# Patient Record
Sex: Female | Born: 1990
Health system: Southern US, Community
[De-identification: ages and names within clinical notes are randomized; demographics above are authoritative.]

## PROBLEM LIST (undated history)

## (undated) HISTORY — PX: WISDOM TOOTH EXTRACTION: SHX21

## (undated) HISTORY — PX: TONSILLECTOMY: SUR1361

---

## 2014-03-10 ENCOUNTER — Ambulatory Visit: Payer: BC Managed Care – PPO

## 2014-03-10 ENCOUNTER — Ambulatory Visit (INDEPENDENT_AMBULATORY_CARE_PROVIDER_SITE_OTHER): Payer: BC Managed Care – PPO | Admitting: Internal Medicine

## 2014-03-10 VITALS — BP 118/60 | HR 75 | Temp 98.5°F | Resp 16 | Ht 64.0 in | Wt 123.2 lb

## 2014-03-10 DIAGNOSIS — R079 Chest pain, unspecified: Secondary | ICD-10-CM

## 2014-03-10 DIAGNOSIS — S20219A Contusion of unspecified front wall of thorax, initial encounter: Secondary | ICD-10-CM

## 2014-03-10 NOTE — Patient Instructions (Signed)
Chest Contusion °A chest contusion is a deep bruise on your chest area. Contusions are the result of an injury that caused bleeding under the skin. A chest contusion may involve bruising of the skin, muscles, or ribs. The contusion may turn blue, purple, or yellow. Minor injuries will give you a painless contusion, but more severe contusions may stay painful and swollen for a few weeks. °CAUSES  °A contusion is usually caused by a blow, trauma, or direct force to an area of the body. °SYMPTOMS  °· Swelling and redness of the injured area. °· Discoloration of the injured area. °· Tenderness and soreness of the injured area. °· Pain. °DIAGNOSIS  °The diagnosis can be made by taking a history and performing a physical exam. An X-ray, CT scan, or MRI may be needed to determine if there were any associated injuries, such as broken bones (fractures) or internal injuries. °TREATMENT  °Often, the best treatment for a chest contusion is resting, icing, and applying cold compresses to the injured area. Deep breathing exercises may be recommended to reduce the risk of pneumonia. Over-the-counter medicines may also be recommended for pain control. °HOME CARE INSTRUCTIONS  °· Put ice on the injured area. °· Put ice in a plastic bag. °· Place a towel between your skin and the bag. °· Leave the ice on for 15-20 minutes, 03-04 times a day. °· Only take over-the-counter or prescription medicines as directed by your caregiver. Your caregiver may recommend avoiding anti-inflammatory medicines (aspirin, ibuprofen, and naproxen) for 48 hours because these medicines may increase bruising. °· Rest the injured area. °· Perform deep-breathing exercises as directed by your caregiver. °· Stop smoking if you smoke. °· Do not lift objects over 5 pounds (2.3 kg) for 3 days or longer if recommended by your caregiver. °SEEK IMMEDIATE MEDICAL CARE IF:  °· You have increased bruising or swelling. °· You have pain that is getting worse. °· You have  difficulty breathing. °· You have dizziness, weakness, or fainting. °· You have blood in your urine or stool. °· You cough up or vomit blood. °· Your swelling or pain is not relieved with medicines. °MAKE SURE YOU:  °· Understand these instructions. °· Will watch your condition. °· Will get help right away if you are not doing well or get worse. °Document Released: 07/30/2001 Document Revised: 07/29/2012 Document Reviewed: 04/27/2012 °ExitCare® Patient Information ©2014 [employer]. ° °

## 2014-03-10 NOTE — Progress Notes (Signed)
   Subjective:    Patient ID: Lisa Stewart, female    DOB: 1991/10/25, 23 y.o.   MRN: [id number]  HPI    Review of Systems     Objective:   Physical Exam        Assessment & Plan:

## 2014-03-10 NOTE — Progress Notes (Signed)
   Subjective:    Patient ID: Lisa Stewart, female    DOB: 07/21/1991, 23 y.o.   MRN: [id number]  HPI Sternal injury x 5 days ago, progressing. Pain began in her chest, but has now moved to her back.    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  Pain with movement. Pain is getting worse thru to back. No dizzy spells or blackouts. Did not strike back    Review of Systems Healthy [employer] student of archtecture     Objective:   Physical Exam  Constitutional: She is oriented to person, place, and time. She appears well-developed and well-nourished. She appears distressed.  HENT:  Head: Normocephalic.  Mouth/Throat: Oropharynx is clear and moist.  Eyes: EOM are normal. Pupils are equal, round, and reactive to light. No scleral icterus.  Neck: Normal range of motion. Neck supple.  Cardiovascular: Normal rate, regular rhythm and normal heart sounds.  Exam reveals no gallop and no friction rub.   No murmur heard. Pulmonary/Chest: Effort normal and breath sounds normal. No respiratory distress.  Abdominal: Soft. There is no tenderness.  Musculoskeletal: She exhibits tenderness.  Lymphadenopathy:    She has cervical adenopathy.  Neurological: She is alert and oriented to person, place, and time. No cranial nerve deficit. She exhibits normal muscle tone. Coordination normal.  Skin: Skin is intact. No abrasion, no bruising and no ecchymosis noted. No erythema.     Painful areas  Psychiatric: She has a normal mood and affect. Her behavior is normal. Judgment and thought content normal.  [facility] reading (PRIMARY) by  Dr.Revere Maahs possible displaced sternal fx distal 1/3rd, no pneumothorax seen, no effusion  Stat xr read all normal, no fx EKG sinus brady/short PR    Assessment & Plan:  Contusion chest wall with progressive pain over 5 days Ice/Heat/Tylenol

## 2015-04-27 DIAGNOSIS — R87618 Other abnormal cytological findings on specimens from cervix uteri: Secondary | ICD-10-CM | POA: Insufficient documentation

## 2016-10-23 ENCOUNTER — Ambulatory Visit (INDEPENDENT_AMBULATORY_CARE_PROVIDER_SITE_OTHER): Payer: 59 | Admitting: Physician Assistant

## 2016-10-23 VITALS — BP 122/72 | HR 53 | Temp 98.2°F | Resp 17 | Ht 64.5 in | Wt 121.0 lb

## 2016-10-23 DIAGNOSIS — L03011 Cellulitis of right finger: Secondary | ICD-10-CM

## 2016-10-23 DIAGNOSIS — Z23 Encounter for immunization: Secondary | ICD-10-CM | POA: Diagnosis not present

## 2016-10-23 DIAGNOSIS — Z91018 Allergy to other foods: Secondary | ICD-10-CM

## 2016-10-23 DIAGNOSIS — M79644 Pain in right finger(s): Secondary | ICD-10-CM | POA: Diagnosis not present

## 2016-10-23 MED ORDER — MUPIROCIN 2 % EX OINT: 1.0000 "application " | TOPICAL_OINTMENT | Freq: Three times a day (TID) | CUTANEOUS | 1 refills | Status: DC

## 2016-10-23 MED ORDER — CEPHALEXIN 500 MG PO CAPS: 500.0000 mg | ORAL_CAPSULE | Freq: Three times a day (TID) | ORAL | 0 refills | Status: AC

## 2016-10-23 NOTE — Progress Notes (Signed)
   Lisa Stewart  MRN: [id number] DOB: 29-May-1991  PCP: No PCP Per Patient  Subjective:  Pt is a 25 year old female who presents to clinic for finger pain.  Three days ago she started feeling pain at the cuticle of her right pointer finger. Since that time she has felt increased swelling, redness and pain.  Notes associated drainage occasionally. Denies fever, chills, numbness, tingling, abnormal sensation. No MOI.   Food allergy - She notes allergy to many foods including apples, melons, pumpkins. Her mouth itches and lips swell. Pumpkins cause her to break out in a rash. She would like to know what she is allergic to so she can avoid the causative agents. She sometimes takes Benadryl. Denies tongue swelling, chest tightness, SOB, difficulty breathing, wheezing, abdominal pain, nausea, vomiting.     Review of Systems  Constitutional: Negative for chills, diaphoresis, fatigue and fever.  Respiratory: Negative for cough, chest tightness, shortness of breath and wheezing.   Cardiovascular: Negative for chest pain and palpitations.  Gastrointestinal: Negative for abdominal pain, diarrhea, nausea and vomiting.  Skin: Positive for color change and wound.  Allergic/Immunologic: Positive for food allergies.  Neurological: Negative for weakness, light-headedness and headaches.    There are no active problems to display for this patient.   No current outpatient prescriptions on file prior to visit.   No current facility-administered medications on file prior to visit.     Not on File   Objective:  BP 122/72 (BP Location: Right Arm, Patient Position: Sitting, Cuff Size: Normal)   Pulse (!) 53   Temp 98.2 F (36.8 C) (Oral)   Resp 17   Ht 5' 4.5" (1.638 m)   Wt 121 lb (54.9 kg)   LMP 10/09/2016 (Approximate)   SpO2 99%   BMI 20.45 kg/m   Physical Exam  Constitutional: She is oriented to person, place, and time and well-developed, well-nourished, and in no distress. No distress.    Cardiovascular: Normal rate, regular rhythm and normal heart sounds.   Neurological: She is alert and oriented to person, place, and time. GCS score is 15.  Skin: Skin is warm and dry.  2nd digit right hand - Swelling and erythema proximal nail bed. Drainage of serous sanguinous and purulent fluid from nailbed with moderate pressure. Mild desquamation proximal nail bed. Not TTP. No streaking. Wound culture taken.  Sensation in tact. Cap refill <3 sec.  Psychiatric: Mood, memory, affect and judgment normal.  Vitals reviewed.   Assessment and Plan :  1. Paronychia of finger of right hand 2. Finger pain, right - cephALEXin (KEFLEX) 500 MG capsule; Take 1 capsule (500 mg total) by mouth 3 (three) times daily.  Dispense: 21 capsule; Refill: 0 - mupirocin ointment (BACTROBAN) 2 %; Apply 1 application topically 3 (three) times daily.  Dispense: 22 g; Refill: 1 - WOUND CULTURE - Encouraged warm compress. RTC in 5-7 days if no improvement.   3. Multiple food allergies - Ambulatory referral to Allergy  4. Need for prophylactic vaccination and inoculation against influenza - Flu Vaccine QUAD 36+ mos IM   Marco CollieWhitney Raydin Bielinski, PA-C  [facility] 10/23/2016 3:40 PM

## 2016-10-23 NOTE — Patient Instructions (Addendum)
Please take the ENTIRE COURSE of your antibiotics. Apply warm compress to the affected area several times a day. Then apply Mupirocin ointment afterwards. Keep it covered at the gym, but otherwise you may keep it open to the air. I will contact you if there is a need for antibiotic medication change.  The allergist will contact you to make an appointment for your testing.   Thank you for coming in today. I hope you feel we met your needs.  Feel free to call [facility] if you have any questions or further requests.  Please consider signing up for MyChart if you do not already have it, as this is a great way to communicate with me.  Best,  Whitney McVey, PA-C    IF you received an x-ray today, you will receive an invoice from [facility]. Please contact [facility] at [phone] with questions or concerns regarding your invoice.   IF you received labwork today, you will receive an invoice from [employer]. Please contact [facility] at [phone] with questions or concerns regarding your invoice.   Our billing staff will not be able to assist you with questions regarding bills from these companies.  You will be contacted with the lab results as soon as they are available. The fastest way to get your results is to activate your My Chart account. Instructions are located on the last page of this paperwork. If you have not heard from [place] regarding the results in 2 weeks, please contact this office.

## 2016-10-26 LAB — WOUND CULTURE

## 2017-04-04 ENCOUNTER — Ambulatory Visit (INDEPENDENT_AMBULATORY_CARE_PROVIDER_SITE_OTHER): Payer: 59 | Admitting: Physician Assistant

## 2017-04-04 ENCOUNTER — Encounter: Payer: Self-pay | Admitting: Physician Assistant

## 2017-04-04 VITALS — BP 99/52 | HR 56 | Temp 98.4°F | Resp 17 | Ht 64.5 in | Wt 127.0 lb

## 2017-04-04 DIAGNOSIS — L723 Sebaceous cyst: Secondary | ICD-10-CM

## 2017-04-04 DIAGNOSIS — L089 Local infection of the skin and subcutaneous tissue, unspecified: Secondary | ICD-10-CM

## 2017-04-04 DIAGNOSIS — L02429 Furuncle of limb, unspecified: Secondary | ICD-10-CM

## 2017-04-04 DIAGNOSIS — L02439 Carbuncle of limb, unspecified: Secondary | ICD-10-CM | POA: Diagnosis not present

## 2017-04-04 NOTE — Progress Notes (Signed)
   Lisa Stewart  MRN: [id number] DOB: 1990-11-26  PCP: Patient, No Pcp Per  Subjective:  Pt is a 26 year old female who presents to clinic for bump in armpit. She noticed a painful spot in her right armpit 5 days ago. Since that time the area became bigger and red. She put ice on the area last night - did not help much.   Denies fever, chills, drainage, n/t.  She has never had this before.  Review of Systems  Constitutional: Negative for chills, diaphoresis, fatigue and fever.  Musculoskeletal: Negative for arthralgias.  Skin: Positive for wound.    There are no active problems to display for this patient.   Current Outpatient Prescriptions on File Prior to Visit  Medication Sig Dispense Refill  . etonogestrel (NEXPLANON) 68 MG IMPL implant 1 each by Subdermal route once.    . mupirocin ointment (BACTROBAN) 2 % Apply 1 application topically 3 (three) times daily. 22 g 1   No current facility-administered medications on file prior to visit.     Not on File   Objective:  BP (!) 99/52   Pulse (!) 56   Temp 98.4 F (36.9 C) (Oral)   Resp 17   Ht 5' 4.5" (1.638 m)   Wt 127 lb (57.6 kg)   LMP 03/05/2017 (Approximate)   SpO2 98%   BMI 21.46 kg/m   Physical Exam  Constitutional: She is oriented to person, place, and time and well-developed, well-nourished, and in no distress. No distress.  Cardiovascular: Normal rate, regular rhythm and normal heart sounds.   Neurological: She is alert and oriented to person, place, and time. GCS score is 15.  Skin: Skin is warm and dry.  2.5 cm mildly fluctuant and erythematous area right axilla. No induration. No drainage. Mild TTP.   Psychiatric: Mood, memory, affect and judgment normal.  Vitals reviewed.  Procedure: Verbal consent obtained. Skin was cleaned with alcohol and anesthetized with Lidocaine with epinephrine. A 1 cm incision was made. A small amount of purulent and sebaceous material expressed. Wound cavity size explored  and sound to be about <2cm. Wound lightly packed with 1/4 inch packing. Wound dressed and wound care discussed.  Assessment and Plan :  1. Infected sebaceous cyst - WOUND CULTURE - Wound was drained and packed. No antibiotics prescribed due to size. Wound culture is pending. Encouraged daily warm compresses and no shaving. RTC in 3 days for wound care. She understands and agrees with plan.   Marco CollieWhitney Marchello Rothgeb, PA-C  [facility] at [facility] 04/04/2017 11:57 AM

## 2017-04-04 NOTE — Patient Instructions (Addendum)
Come back on Monday to have your wound assessed.  Please see below for home care tips:   Change dressing if dressing is soiled. Keep covered at all times except when showering. You may get wet in the shower but do not scrub.  Apply warm compresses/heating pad to facilitate drainage. 10 minutes 3 times a day.  Leave packing in place. Do not apply any ointments as this may delay healing. If an antibiotic was prescribed, take as directed until finished. Return on Monday for wound care.  IF you received an x-ray today, you will receive an invoice from [facility]. Please contact [facility] at [phone] with questions or concerns regarding your invoice.   IF you received labwork today, you will receive an invoice from [facility]. Please contact [facility] at [phone] with questions or concerns regarding your invoice.   Our billing staff will not be able to assist you with questions regarding bills from these companies.  You will be contacted with the lab results as soon as they are available. The fastest way to get your results is to activate your My Chart account. Instructions are located on the last page of this paperwork. If you have not heard from us regarding the results in 2 weeks, please contact this office.

## 2017-04-06 LAB — WOUND CULTURE

## 2017-04-07 ENCOUNTER — Ambulatory Visit: Payer: 59 | Admitting: Physician Assistant

## 2017-04-09 NOTE — Progress Notes (Signed)
No show for f/u. Left Vm.

## 2017-04-10 ENCOUNTER — Telehealth: Payer: Self-pay | Admitting: Physician Assistant

## 2017-04-10 NOTE — Telephone Encounter (Signed)
Pt states that she is returning your phone call.  Pt contact number [phone].

## 2017-04-21 ENCOUNTER — Ambulatory Visit (INDEPENDENT_AMBULATORY_CARE_PROVIDER_SITE_OTHER): Payer: 59 | Admitting: Physician Assistant

## 2017-04-21 ENCOUNTER — Encounter: Payer: Self-pay | Admitting: Physician Assistant

## 2017-04-21 VITALS — BP 102/67 | HR 67 | Temp 98.3°F | Resp 16 | Ht 64.0 in | Wt 122.6 lb

## 2017-04-21 DIAGNOSIS — L03119 Cellulitis of unspecified part of limb: Secondary | ICD-10-CM | POA: Diagnosis not present

## 2017-04-21 DIAGNOSIS — L089 Local infection of the skin and subcutaneous tissue, unspecified: Secondary | ICD-10-CM | POA: Diagnosis not present

## 2017-04-21 DIAGNOSIS — M79621 Pain in right upper arm: Secondary | ICD-10-CM | POA: Diagnosis not present

## 2017-04-21 DIAGNOSIS — L723 Sebaceous cyst: Secondary | ICD-10-CM

## 2017-04-21 DIAGNOSIS — L02411 Cutaneous abscess of right axilla: Secondary | ICD-10-CM

## 2017-04-21 DIAGNOSIS — L02419 Cutaneous abscess of limb, unspecified: Secondary | ICD-10-CM | POA: Diagnosis not present

## 2017-04-21 DIAGNOSIS — M79604 Pain in right leg: Secondary | ICD-10-CM | POA: Diagnosis not present

## 2017-04-21 MED ORDER — DOXYCYCLINE HYCLATE 100 MG PO TABS: 100.0000 mg | ORAL_TABLET | Freq: Two times a day (BID) | ORAL | 0 refills | Status: DC

## 2017-04-21 MED ORDER — HYDROCODONE-ACETAMINOPHEN 5-325 MG PO TABS: 1.0000 | ORAL_TABLET | Freq: Four times a day (QID) | ORAL | 0 refills | Status: DC | PRN

## 2017-04-21 NOTE — Progress Notes (Signed)
Lisa Stewart  MRN: [id number] DOB: 1991/07/17  PCP: Patient, No Pcp Per  Subjective:  Pt is a pleasant 26 year old female who presents to clinic for multiple complaints.   Sore on leg - She was in a "mud run" this weekend and got a cut on her lower right leg. c/o worsening pain, redness and drainage from the area. She is elevating it, applying warm compress and compression - helps a little.   Boil in right armpit x 3 days. She has been applying warm compress to the area for the past 2 days or so. She cannot get comfortable sleeping. She has had this before in the same area a few weeks ago - received an I&D, got better, no problems. Denies fever, chills, n/v, decreased ROM.    No FHx diabetes  Review of Systems  Constitutional: Negative for chills, diaphoresis, fatigue and fever.  Skin: Positive for wound.  Psychiatric/Behavioral: Positive for sleep disturbance.    There are no active problems to display for this patient.   Current Outpatient Prescriptions on File Prior to Visit  Medication Sig Dispense Refill  . etonogestrel (NEXPLANON) 68 MG IMPL implant 1 each by Subdermal route once.    . mupirocin ointment (BACTROBAN) 2 % Apply 1 application topically 3 (three) times daily. 22 g 1   No current facility-administered medications on file prior to visit.     No Known Allergies   Objective:  BP 102/67   Pulse 67   Temp 98.3 F (36.8 C) (Oral)   Resp 16   Ht '5\' 4"'$  (1.626 m)   Wt 122 lb 9.6 oz (55.6 kg)   SpO2 96%   BMI 21.04 kg/m   Physical Exam  Constitutional: She is oriented to person, place, and time and well-developed, well-nourished, and in no distress. No distress.  Cardiovascular: Normal rate, regular rhythm and normal heart sounds.   Neurological: She is alert and oriented to person, place, and time. GCS score is 15.  Skin: Skin is warm and dry.     3cm nodular area of fluctuance with erythema and mild induration right axilla. No drainage appreciated.    2cm erythematous and indurated lesion medial to other area of concern. TTP, mild ulceration. No drainage.    Psychiatric: Mood, memory, affect and judgment normal.  Vitals reviewed.  Procedure: Verbal consent obtained. Skin was cleaned with alcohol and anesthetized with 2cc lidocaine with epinephrine. A 1 cm incision was made. A moderate amount of purulent and cystic material expressed. Wound culture taken. Wound cavity size is estimated to be about 2.5cm medially. Wound lightly packed with 1/4 inch packing.  A 1 cm incision was made at second site. Bloody drainage only was expressed. Wound was not packed.  Wound dressed and wound care discussed.  Assessment and Plan :  1. Infected sebaceous cyst 2. Abscess of right axilla - WOUND CULTURE - CMP14+EGFR - doxycycline (VIBRA-TABS) 100 MG tablet; Take 1 tablet (100 mg total) by mouth 2 (two) times daily.  Dispense: 14 tablet; Refill: 0 - Moderate amount of purulent and cystic material expressed from lateral site, lightly packed. Wound culture is pending. Started pt on antibiotics. Encouraged con't warm compresses. Discussed improved shaving techniques, information printed out. Will screen for DM, as this is her 3rd infection < 1 month.  RTC in 48 hours for wound check. She agrees with plan.   3. Pain in right axilla - HYDROcodone-acetaminophen (NORCO) 5-325 MG tablet; Take 1 tablet by mouth every 6 (six)  hours as needed for moderate pain. Dispense: 12 tablet; Refill: 0  4. Cellulitis and abscess of leg 5. Pain of right lower extremity - WOUND CULTURE - Culture pending. Start Doxy. Advised warm compress to help with drainage.   Mercer Pod, PA-C  Primary Care at [facility] 04/21/2017 9:37 AM

## 2017-04-21 NOTE — Patient Instructions (Addendum)
  Change dressing if dressing is soiled. Keep covered at all times except when showering. You may get wet in the shower but do not scrub.  Apply warm compresses/heating pad to facilitate drainage. Leave packing in place. Do not apply any ointments as this may delay healing. If an antibiotic was prescribed, take as directed until finished. Return in 48 hours for wound care.  Thank you for coming in today. I hope you feel we met your needs.  Feel free to call [facility] if you have any questions or further requests.  Please consider signing up for MyChart if you do not already have it, as this is a great way to communicate with me.  Best,  Whitney McVey, PA-C  Adjustments to the patient's shaving routine or the use of electric clippers or chemical depilatories instead of razors may be attempted. The choice of intervention is influenced by patient preference. If a particular intervention does not yield favorable results, one of the other approaches may be implemented.  Adjustments to shaving routine-Adjustments to the shaving routine that may be useful for patients with pseudofolliculitis barbae who desire to continue shaving include the following:  ?The application of generous amounts of a highly lubricating shaving cream or gel for 5 to 10 minutes prior to shaving softens the hair and may help to reduce the formation of sharp hair tips during shaving. The application of warm compresses prior to shaving may also be used to achieve this goal [3].  ?Use of a single blade razor rather than a double-bladed or multiple-bladed razor may help to reduce the production of very short cut hairs that subsequently become embedded in the interfollicular skin. In addition, shaving should be performed only in the direction of hair growth. Specialized razors that use a guard system to prevent a very close shave (eg, Bump Fighter razor) may also be helpful [8]. Stretching of the skin during shaving should be  avoided. ?Gentle washing of the beard area in a circular motion with a mildly abrasive cloth or sponge for a few minutes daily may help to free embedded hairs [2]. Some authors have advocated using a magnifying mirror to identify embedded hairs, after which the embedded distal tips can be dislodged with a toothpick or sterile needle [2,9]. Plucking of hairs from the follicle should be avoided, since this may lead to follicular inflammation and the subsequent growth of hair that penetrates the follicular wall [2,4]. Electric clippers-Electric clippers can be used to shave hair in a manner that leaves the remaining hair at a greater length than razor shaves. Patients should be encouraged to leave the remaining hair at a length of at least 1 mm to reduce the risk of lesion recurrence [2].   Long-term hair reduction-Because pseudofolliculitis barbae occurs only in areas of hair regrowth, the destruction of hair follicles can lead to improvement in the condition [10,11]. Laser hair removal is the preferred method for hair reduction in patients with pseudofolliculitis barbae.  Laser hair removal-Laser hair removal works through selective destruction of hair follicles, and results in a reduction in the density and thickness of hair. Multiple treatment sessions are usually required to achieve a major reduction in hair growth because only a minority of hair follicles are destroyed during each treatment.

## 2017-04-22 LAB — CMP14+EGFR
ALT: 16 IU/L (ref 0–32)
AST: 22 IU/L (ref 0–40)
Albumin/Globulin Ratio: 1.6 (ref 1.2–2.2)
Albumin: 4.7 g/dL (ref 3.5–5.5)
Alkaline Phosphatase: 74 IU/L (ref 39–117)
BUN/Creatinine Ratio: 16 (ref 9–23)
BUN: 12 mg/dL (ref 6–20)
Bilirubin Total: 0.5 mg/dL (ref 0.0–1.2)
CO2: 25 mmol/L (ref 18–29)
Calcium: 9.9 mg/dL (ref 8.7–10.2)
Chloride: 100 mmol/L (ref 96–106)
Creatinine, Ser: 0.77 mg/dL (ref 0.57–1.00)
GFR calc Af Amer: 123 mL/min/{1.73_m2} (ref >59)
GFR calc non Af Amer: 107 mL/min/{1.73_m2} (ref >59)
Globulin, Total: 3 g/dL (ref 1.5–4.5)
Glucose: 87 mg/dL (ref 65–99)
Potassium: 5 mmol/L (ref 3.5–5.2)
Sodium: 141 mmol/L (ref 134–144)
Total Protein: 7.7 g/dL (ref 6.0–8.5)

## 2017-04-23 ENCOUNTER — Encounter: Payer: Self-pay | Admitting: Physician Assistant

## 2017-04-23 LAB — WOUND CULTURE

## 2017-04-24 ENCOUNTER — Ambulatory Visit: Payer: 59 | Admitting: Physician Assistant

## 2017-04-24 ENCOUNTER — Encounter: Payer: Self-pay | Admitting: Physician Assistant

## 2017-04-24 ENCOUNTER — Ambulatory Visit (INDEPENDENT_AMBULATORY_CARE_PROVIDER_SITE_OTHER): Payer: 59 | Admitting: Physician Assistant

## 2017-04-24 VITALS — BP 97/60 | HR 49 | Temp 97.9°F | Resp 18 | Ht 64.0 in | Wt 129.2 lb

## 2017-04-24 DIAGNOSIS — L089 Local infection of the skin and subcutaneous tissue, unspecified: Secondary | ICD-10-CM

## 2017-04-24 DIAGNOSIS — Z5189 Encounter for other specified aftercare: Secondary | ICD-10-CM

## 2017-04-24 DIAGNOSIS — L03115 Cellulitis of right lower limb: Secondary | ICD-10-CM

## 2017-04-24 DIAGNOSIS — L723 Sebaceous cyst: Secondary | ICD-10-CM

## 2017-04-24 NOTE — Progress Notes (Signed)
   Lisa DownsMorgan Sinn  MRN: [id number] DOB: [birth year]  PCP: Patient, No Pcp Per  Subjective:  Pt is a pleasant 26 year old female who presents to clinic for f/u abscess. She was here 3 days ago and received I&D with packing. Her packing in armpit came out yesterday. Wound culture from that visit is positive for staphylococcus aureus. She continues to take daily Doxycyline. She is using warm compresses. C/o continued pain in her right leg, however this is improving daily.  Denies fever, chills, n/v, drainage, increased redness/swelling/tenderness.   Review of Systems  Constitutional: Negative for chills, diaphoresis and fever.  Skin: Positive for wound.    There are no active problems to display for this patient.   Current Outpatient Prescriptions on File Prior to Visit  Medication Sig Dispense Refill  . doxycycline (VIBRA-TABS) 100 MG tablet Take 1 tablet (100 mg total) by mouth 2 (two) times daily. 14 tablet 0  . etonogestrel (NEXPLANON) 68 MG IMPL implant 1 each by Subdermal route once.    Marland Kitchen. HYDROcodone-acetaminophen (NORCO) 5-325 MG tablet Take 1 tablet by mouth every 6 (six) hours as needed for moderate pain. May increase to 7.5mg  q 6 hrs. Max dose 10mg  q 6 hrs. 12 tablet 0  . mupirocin ointment (BACTROBAN) 2 % Apply 1 application topically 3 (three) times daily. 22 g 1   No current facility-administered medications on file prior to visit.     No Known Allergies   Objective:  BP 97/60 (BP Location: Right Arm, Patient Position: Sitting, Cuff Size: Small)   Pulse (!) 49   Temp 97.9 F (36.6 C) (Oral)   Resp 18   Ht 5\' 4"  (1.626 m)   Wt 129 lb 3.2 oz (58.6 kg)   SpO2 100%   BMI 22.18 kg/m   Physical Exam  Constitutional: She is oriented to person, place, and time. No distress.  Neurological: She is alert and oriented to person, place, and time. GCS score is 15.  Skin: She is not diaphoretic.  Right axilla looks great. Erythema and swelling has improved since her last visit.  No drainage. Wound irrigated with 10 cc lidocaine. She is TTP. Wound lightly packed with 1/4 in dressing. Wound extends 2cm inferomedially.  Right LES, unsure if area of erythema and induration has improved or not. Outlined area with surgical marker. No drainage. Mild TTP.   Psychiatric: Mood, memory, affect and judgment normal.   Assessment and Plan :  1. Encounter for wound care 2. Infected sebaceous cyst 3. Cellulitis of right lower extremity - Wound culture positive for staph. She is taking Doxy daily.  Axillary abscess lightly repacked with 1/4 in dressing. Surgical marker used to mark LES cellulitis.  She is still TTP. RTC in 48 hours for wound care. Con't warm compress and antibiotic.    Marco CollieWhitney Curry Dulski, PA-C  [facility] at [facility] 04/24/2017 12:07 PM

## 2017-04-24 NOTE — Patient Instructions (Addendum)
Con't taking antibiotics.  Change dressing if dressing is soiled. Keep covered at all times except when showering. You may get wet in the shower but do not scrub.  Apply warm compresses/heating pad to facilitate drainage. Leave packing in place. Do not apply any ointments as this may delay healing. If an antibiotic was prescribed, take as directed until finished. Return in 48 hoursfor wound care.    Thank you for coming in today. I hope you feel we met your needs.  Feel free to call [facility] if you have any questions or further requests.  Please consider signing up for MyChart if you do not already have it, as this is a great way to communicate with me.  Best,  Whitney McVey, PA-C   IF you received an x-ray today, you will receive an invoice from [facility]. Please contact [facility] at [phone] with questions or concerns regarding your invoice.   IF you received labwork today, you will receive an invoice from [facility]. Please contact [facility] at [phone] with questions or concerns regarding your invoice.   Our billing staff will not be able to assist you with questions regarding bills from these companies.  You will be contacted with the lab results as soon as they are available. The fastest way to get your results is to activate your My Chart account. Instructions are located on the last page of this paperwork. If you have not heard from [place] regarding the results in 2 weeks, please contact this office.

## 2017-04-26 ENCOUNTER — Encounter: Payer: Self-pay | Admitting: Physician Assistant

## 2017-04-26 ENCOUNTER — Ambulatory Visit (INDEPENDENT_AMBULATORY_CARE_PROVIDER_SITE_OTHER): Payer: 59 | Admitting: Physician Assistant

## 2017-04-26 DIAGNOSIS — L02411 Cutaneous abscess of right axilla: Secondary | ICD-10-CM | POA: Insufficient documentation

## 2017-04-26 DIAGNOSIS — Z23 Encounter for immunization: Secondary | ICD-10-CM | POA: Diagnosis not present

## 2017-04-26 DIAGNOSIS — L02419 Cutaneous abscess of limb, unspecified: Secondary | ICD-10-CM | POA: Insufficient documentation

## 2017-04-26 MED ORDER — DOXYCYCLINE HYCLATE 100 MG PO TABS: 100.0000 mg | ORAL_TABLET | Freq: Two times a day (BID) | ORAL | 0 refills | Status: AC

## 2017-04-26 NOTE — Patient Instructions (Addendum)
Continue the warm compresses and daily washing. Continue the doxycycline for 7 more days. If you are not improving, or if symptoms worsen, return.    IF you received an x-ray today, you will receive an invoice from [facility]. Please contact [facility] at [phone] with questions or concerns regarding your invoice.   IF you received labwork today, you will receive an invoice from [facility]. Please contact [facility] at [phone] with questions or concerns regarding your invoice.   Our billing staff will not be able to assist you with questions regarding bills from these companies.  You will be contacted with the lab results as soon as they are available. The fastest way to get your results is to activate your My Chart account. Instructions are located on the last page of this paperwork. If you have not heard from us regarding the results in 2 weeks, please contact this office.

## 2017-04-26 NOTE — Progress Notes (Signed)
   Subjective:    Patient ID: Lisa Stewart, female    DOB: [birth year], 26 y.o.   MRN: [id number]  HPI: 26 y/o F presents today for f/u of right axilla abscess and right calf abscess. Both developed about 8 days ago. She has a history of axilla abscesses. The abscess on her calf developed after she had a cut on her calf and ran in a mud run. She was seen here 5 days ago and then 2 days ago. The axilla abscess was I&D'd and  antibacterial packing was placed. Wound culture x 2 came back positive for MRSA. The wound is only slightly tender, and packing has stayed in place. She discarded her deodorant, razors, soap and shaving cream.  The wound on her calf had some purulent discharge when squeezed in office 5 days ago, and again that night. Since then it has not drained. The erythema surrounding the wound has decreased, along with the pain associated. She is using an ACE bandage to compress the calf which is helping with the pain. When she does not wear it and her leg is not elevated, it throbs.  Patient was placed on doxycycline 5 days ago and take her last 2 doses tomorrow.  She denies fever, chills, nausea, vomiting, diarrhea, lymphangitis and decreased ROM.      Review of Systems  Constitutional: Negative for chills and fever.  HENT: Negative.   Eyes: Negative.   Respiratory: Negative.  Negative for cough and shortness of breath.   Cardiovascular: Positive for leg swelling (swelling surrounding wound on right calf).  Gastrointestinal: Negative.   Endocrine: Negative.   Genitourinary: Negative.   Musculoskeletal: Negative.  Negative for arthralgias, joint swelling and neck stiffness.  Skin: Positive for wound (right axilla and right calf). Negative for color change and rash.  Neurological: Negative.  Negative for dizziness, weakness, light-headedness, numbness and headaches.  Hematological: Negative.   Psychiatric/Behavioral: Negative.        Objective:   Physical Exam  Constitutional:  She appears well-developed and well-nourished. No distress.  HENT:  Head: Normocephalic and atraumatic.  Eyes: Conjunctivae and EOM are normal. Pupils are equal, round, and reactive to light.  Neck: Normal range of motion. Neck supple.  Cardiovascular: Normal rate, regular rhythm, normal heart sounds and intact distal pulses.   Pulmonary/Chest: Effort normal and breath sounds normal.  Musculoskeletal: Normal range of motion. She exhibits edema (right axilla abscess tender in surrounding area) and tenderness.  Skin: Skin is warm and dry. She is not diaphoretic. There is erythema.  Right abscess about 2x2 cm in size with erythema. packing still in place with serosanguinous           Assessment & Plan:

## 2017-04-26 NOTE — Progress Notes (Signed)
     Patient ID: Lisa Stewart, female    DOB: 1990/12/16, 26 y.o.   MRN: [id number]  PCP: Patient, No Pcp Per  Chief Complaint  Patient presents with  . Wound Check    Right leg, right axillary area    Subjective:   Presents for evaluation of wounds of the RIGHT axilla and RIGHT calf, following I&D of abscesses in these locations. I&D was performed at both locations, and she was improving at follow-up 2 days ago.  Wound cultures were MRSA.  She is tolerating doxycyline and notes significant improvement. Less pain. Less redness. No drainage since last visit.  No fever, chills, nausea, vomiting.    Review of Systems As above.    Patient Active Problem List   Diagnosis Date Noted  . Abscess of axilla, right 04/26/2017  . Abscess of calf 04/26/2017     Prior to Admission medications   Medication Sig Start Date End Date Taking? Authorizing Provider  doxycycline (VIBRA-TABS) 100 MG tablet Take 1 tablet (100 mg total) by mouth 2 (two) times daily. 04/21/17 04/28/17 Yes McVey, Madelaine BhatElizabeth Whitney, PA-C  etonogestrel (NEXPLANON) 68 MG IMPL implant 1 each by Subdermal route once.   Yes [provider]  HYDROcodone-acetaminophen (NORCO) 5-325 MG tablet Take 1 tablet by mouth every 6 (six) hours as needed for moderate pain. May increase to 7.5mg  q 6 hrs. Max dose 10mg  q 6 hrs. 04/21/17  Yes McVey, Madelaine BhatElizabeth Whitney, PA-C  mupirocin ointment (BACTROBAN) 2 % Apply 1 application topically 3 (three) times daily. 10/23/16  Yes McVey, Madelaine BhatElizabeth Whitney, PA-C     No Known Allergies     Objective:  Physical Exam  Constitutional: She is oriented to person, place, and time. She appears well-developed and well-nourished. She is active and cooperative. No distress.  There were no vitals taken for this visit.   Eyes: Conjunctivae are normal.  Pulmonary/Chest: Effort normal.  Neurological: She is alert and oriented to person, place, and time.  Skin: Skin is warm and dry.  RIGHT  axilla with mild tenderness and edema. No purulence. No erythema. No need for additional packing. RIGHT calf wound 2 x 2 cm. Moderate tenderness and erythema. Mild edema. No purulence. No need for packing.  Psychiatric: She has a normal mood and affect. Her speech is normal and behavior is normal.           Assessment & Plan:   1. Abscess of axilla, right 2. Abscess of calf Elect to extend antibiotic course additional 7 days. Daily washing with soap and water. Warm compresses. Monitor for worsening symptoms. Anticipatory guidance provided. - doxycycline (VIBRA-TABS) 100 MG tablet; Take 1 tablet (100 mg total) by mouth 2 (two) times daily.  Dispense: 14 tablet; Refill: 0  3. Need for Tdap vaccination Not current. Given today. - Tdap vaccine greater than or equal to 7yo IM - Care order/instruction:    Return if symptoms worsen or fail to improve.   Fernande Brashelle S. Timika Muench, PA-C Primary Care at [facility]

## 2017-06-09 ENCOUNTER — Encounter: Payer: Self-pay | Admitting: Physician Assistant

## 2017-06-09 ENCOUNTER — Ambulatory Visit (INDEPENDENT_AMBULATORY_CARE_PROVIDER_SITE_OTHER): Payer: 59 | Admitting: Physician Assistant

## 2017-06-09 VITALS — BP 103/66 | HR 52 | Temp 98.1°F | Resp 16 | Ht 64.0 in | Wt 127.8 lb

## 2017-06-09 DIAGNOSIS — L089 Local infection of the skin and subcutaneous tissue, unspecified: Secondary | ICD-10-CM

## 2017-06-09 MED ORDER — SULFAMETHOXAZOLE-TRIMETHOPRIM 800-160 MG PO TABS: 1.0000 | ORAL_TABLET | Freq: Two times a day (BID) | ORAL | 0 refills | Status: DC

## 2017-06-09 MED ORDER — CHLORHEXIDINE GLUCONATE 4 % EX LIQD: Freq: Once | CUTANEOUS | 0 refills | Status: AC

## 2017-06-09 MED ORDER — TRIAMCINOLONE ACETONIDE 0.1 % EX CREA: 1.0000 "application " | TOPICAL_CREAM | Freq: Two times a day (BID) | CUTANEOUS | 0 refills | Status: DC

## 2017-06-09 MED ORDER — MUPIROCIN 2 % EX OINT: 1.0000 "application " | TOPICAL_OINTMENT | Freq: Two times a day (BID) | CUTANEOUS | 1 refills | Status: AC

## 2017-06-09 NOTE — Progress Notes (Signed)
PR[facility] AT [facility] [address], [place] [id number] [phone]  Date:  06/09/2017   Name:  Lisa Stewart   DOB:  10/15/1991   MRN:  [id number]  PCP:  Patient, No Pcp Per    History of Present Illness:  Lisa Stewart is a 26 y.o. female patient who presents to PCP with  Chief Complaint  Patient presents with  . Abscess    two in right axilla pt states it is a reoccuring thing and had MRSA last time so is concerned it didnt go away      1 week ago, she had a bump showed up at her right axillary.   She had another one show up at her right axillary.  She has used warm compresses.   She is working out at [employer] gym about 2 weeks ago restarted.  She has no fever.  Not very painful.  She has concern of these recurrent lesions. To note, she was treated about 3 weeks ago for sebaceous cyst that was infecteed--s/p I and D. Lesions appeared at her lower right leg extremity as well while she was being treated with doxycycline.  abx was extended. Wound culture yielded mrsa. This had resolved until 1 week ago.  She notes that she has changed all hygeine products, and sheets.  Currently not sexually active.  She is not performing any exercises where she has underarm exposure to gym machines.    Patient Active Problem List   Diagnosis Date Noted  . Abscess of axilla, right 04/26/2017  . Abscess of calf 04/26/2017    No past medical history on file.  Past Surgical History:  Procedure Laterality Date  . TONSILLECTOMY Bilateral   . WISDOM TOOTH EXTRACTION      Social History  Substance Use Topics  . Smoking status: Never Smoker  . Smokeless tobacco: Never Used  . Alcohol use No    Family History  Problem Relation Age of Onset  . Cancer Maternal Grandmother   . Cancer Paternal Grandmother     No Known Allergies  Medication list has been reviewed and updated.  Current Outpatient Prescriptions on File Prior to Visit  Medication Sig Dispense Refill  . etonogestrel  (NEXPLANON) 68 MG IMPL implant 1 each by Subdermal route once.    Marland Kitchen. HYDROcodone-acetaminophen (NORCO) 5-325 MG tablet Take 1 tablet by mouth every 6 (six) hours as needed for moderate pain. May increase to 7.5mg  q 6 hrs. Max dose 10mg  q 6 hrs. 12 tablet 0  . mupirocin ointment (BACTROBAN) 2 % Apply 1 application topically 3 (three) times daily. 22 g 1   No current facility-administered medications on file prior to visit.     ROS ROS otherwise unremarkable unless listed above.  Physical Examination: BP 103/66   Pulse (!) 52   Temp 98.1 F (36.7 C) (Oral)   Resp 16   Ht 5\' 4"  (1.626 m)   Wt 127 lb 12.8 oz (58 kg)   LMP 05/10/2017   SpO2 99%   BMI 21.94 kg/m  Ideal Body Weight: Weight in (lb) to have BMI = 25: 145.3  Physical Exam  Constitutional: She is oriented to person, place, and time. She appears well-developed and well-nourished. No distress.  HENT:  Head: Normocephalic and atraumatic.  Right Ear: External ear normal.  Left Ear: External ear normal.  Eyes: Pupils are equal, round, and reactive to light. Conjunctivae and EOM are normal.  Cardiovascular: Normal rate.   Pulmonary/Chest: Effort normal. No respiratory distress.  Neurological: She is alert and oriented to person, place, and time.  Skin: She is not diaphoretic.  Hard 1cm nodular lesion at the right axillary with mild erythema but without fluctuance.  Open lesion just medial to area consistent with the abscess at initiation of symptoms.  Scarring at the under arm without fistular sites.    Psychiatric: She has a normal mood and affect. Her behavior is normal.     Assessment and Plan: Lisa Stewart is a 26 y.o. female who is here today for cc of abscess. Advised warm compresses.  Will start bactrim.   Possibility of mrsa colonization.  Continue to not shave.  Advised chlorhexidine and precautionary use.  Will also attempt mupirocin at the nares for 5 days bid.   rtc as needed.   Skin infection - Plan:  chlorhexidine (HIBICLENS) 4 % external liquid, mupirocin ointment (BACTROBAN) 2 %, sulfamethoxazole-trimethoprim (BACTRIM DS,SEPTRA DS) 800-160 MG tablet  Trena Platt, PA-C [facility] 7/24/20188:55 AM

## 2017-06-09 NOTE — Patient Instructions (Addendum)
You can apply the chlorhexidine to the body, avoiding face and genitals once You can apply the mupirocin to the nares for 5 days.     IF you received an x-ray today, you will receive an invoice from [facility]. Please contact [facility] at [phone] with questions or concerns regarding your invoice.   IF you received labwork today, you will receive an invoice from [facility]. Please contact [facility] at [phone] with questions or concerns regarding your invoice.   Our billing staff will not be able to assist you with questions regarding bills from these companies.  You will be contacted with the lab results as soon as they are available. The fastest way to get your results is to activate your My Chart account. Instructions are located on the last page of this paperwork. If you have not heard from us regarding the results in 2 weeks, please contact this office.

## 2017-07-22 ENCOUNTER — Ambulatory Visit (INDEPENDENT_AMBULATORY_CARE_PROVIDER_SITE_OTHER): Payer: 59 | Admitting: Emergency Medicine

## 2017-07-22 ENCOUNTER — Encounter: Payer: Self-pay | Admitting: Emergency Medicine

## 2017-07-22 VITALS — BP 97/56 | HR 47 | Temp 98.8°F | Resp 16 | Ht 63.75 in | Wt 127.8 lb

## 2017-07-22 DIAGNOSIS — L02411 Cutaneous abscess of right axilla: Secondary | ICD-10-CM

## 2017-07-22 DIAGNOSIS — L089 Local infection of the skin and subcutaneous tissue, unspecified: Secondary | ICD-10-CM | POA: Insufficient documentation

## 2017-07-22 DIAGNOSIS — Z23 Encounter for immunization: Secondary | ICD-10-CM | POA: Insufficient documentation

## 2017-07-22 MED ORDER — SULFAMETHOXAZOLE-TRIMETHOPRIM 800-160 MG PO TABS: 1.0000 | ORAL_TABLET | Freq: Two times a day (BID) | ORAL | 0 refills | Status: AC

## 2017-07-22 NOTE — Patient Instructions (Addendum)
     IF you received an x-ray today, you will receive an invoice from [facility]. Please contact [facility] at [phone] with questions or concerns regarding your invoice.   IF you received labwork today, you will receive an invoice from [facility]. Please contact [facility] at [phone] with questions or concerns regarding your invoice.   Our billing staff will not be able to assist you with questions regarding bills from these companies.  You will be contacted with the lab results as soon as they are available. The fastest way to get your results is to activate your My Chart account. Instructions are located on the last page of this paperwork. If you have not heard from us regarding the results in 2 weeks, please contact this office.     Skin Abscess A skin abscess is an infected area on or under your skin that contains pus and other material. An abscess can happen almost anywhere on your body. Some abscesses break open (rupture) on their own. Most continue to get worse unless they are treated. The infection can spread deeper into the body and into your blood, which can make you feel sick. Treatment usually involves draining the abscess. Follow these instructions at home: Abscess Care  If you have an abscess that has not drained, place a warm, clean, wet washcloth over the abscess several times a day. Do this as told by your doctor.  Follow instructions from your doctor about how to take care of your abscess. Make sure you: ? Cover the abscess with a bandage (dressing). ? Change your bandage or gauze as told by your doctor. ? Wash your hands with soap and water before you change the bandage or gauze. If you cannot use soap and water, use hand sanitizer.  Check your abscess every day for signs that the infection is getting worse. Check for: ? More redness, swelling, or pain. ? More fluid or blood. ? Warmth. ? More pus or a bad smell. Medicines   Take  over-the-counter and prescription medicines only as told by your doctor.  If you were prescribed an antibiotic medicine, take it as told by your doctor. Do not stop taking the antibiotic even if you start to feel better. General instructions  To avoid spreading the infection: ? Do not share personal care items, towels, or hot tubs with others. ? Avoid making skin-to-skin contact with other people.  Keep all follow-up visits as told by your doctor. This is important. Contact a doctor if:  You have more redness, swelling, or pain around your abscess.  You have more fluid or blood coming from your abscess.  Your abscess feels warm when you touch it.  You have more pus or a bad smell coming from your abscess.  You have a fever.  Your muscles ache.  You have chills.  You feel sick. Get help right away if:  You have very bad (severe) pain.  You see red streaks on your skin spreading away from the abscess. This information is not intended to replace advice given to you by your health care provider. Make sure you discuss any questions you have with your health care provider. Document Released: 04/22/2008 Document Revised: 06/30/2016 Document Reviewed: 09/13/2015 Elsevier Interactive Patient Education  2018 Elsevier Inc.  

## 2017-07-22 NOTE — Progress Notes (Signed)
Lisa Stewart 26 y.o.   Chief Complaint  Patient presents with  . Cyst    RIGHT UNDERARM with pain x 2 days    HISTORY OF PRESENT ILLNESS: This is a 26 y.o. female complaining of cyst in right axilla getting infected; has h/o same multiple times in the past.  HPI   Prior to Admission medications   Medication Sig Start Date End Date Taking? Authorizing Provider  triamcinolone cream (KENALOG) 0.1 % Apply 1 application topically 2 (two) times daily. Use no longer than 2 weeks 06/09/17  Yes English, Stephanie D, PA  etonogestrel (NEXPLANON) 68 MG IMPL implant 1 each by Subdermal route once.    [provider]  HYDROcodone-acetaminophen (NORCO) 5-325 MG tablet Take 1 tablet by mouth every 6 (six) hours as needed for moderate pain. May increase to 7.5mg  q 6 hrs. Max dose 10mg  q 6 hrs. Patient not taking: Reported on 07/22/2017 04/21/17   McVey, Madelaine BhatElizabeth Whitney, PA-C  sulfamethoxazole-trimethoprim (BACTRIM DS,SEPTRA DS) 800-160 MG tablet Take 1 tablet by mouth 2 (two) times daily. Patient not taking: Reported on 07/22/2017 06/09/17   Trena PlattEnglish, Stephanie D, PA    No Known Allergies  Patient Active Problem List   Diagnosis Date Noted  . Abscess of axilla, right 04/26/2017  . Abscess of calf 04/26/2017    No past medical history on file.  Past Surgical History:  Procedure Laterality Date  . TONSILLECTOMY Bilateral   . WISDOM TOOTH EXTRACTION      Social History   Social History  . Marital status: Single    Spouse name: N/A  . Number of children: N/A  . Years of education: N/A   Occupational History  . Not on file.   Social History Main Topics  . Smoking status: Never Smoker  . Smokeless tobacco: Never Used  . Alcohol use No  . Drug use: No  . Sexual activity: No   Other Topics Concern  . Not on file   Social History Narrative  . No narrative on file    Family History  Problem Relation Age of Onset  . Cancer Maternal Grandmother   . Cancer Paternal  Grandmother      Review of Systems  Constitutional: Negative.  Negative for chills and fever.  Gastrointestinal: Negative for nausea and vomiting.  Skin: Negative for rash.   Vitals:   07/22/17 1127  BP: (!) 97/56  Pulse: (!) 47  Resp: 16  Temp: 98.8 F (37.1 C)  SpO2: 98%    Physical Exam  Constitutional: She is oriented to person, place, and time. She appears well-developed and well-nourished.  HENT:  Head: Normocephalic and atraumatic.  Eyes: Pupils are equal, round, and reactive to light. EOM are normal.  Neck: Normal range of motion. Neck supple.  Cardiovascular: Normal rate and regular rhythm.   Pulmonary/Chest: Effort normal.  Musculoskeletal: Normal range of motion.  Neurological: She is alert and oriented to person, place, and time.  Skin: Capillary refill takes less than 2 seconds.  Right axilla: small palpable nodes with small erythematous infected cyst; hard with no fluctuation.  Psychiatric: She has a normal mood and affect.  Vitals reviewed.    ASSESSMENT & PLAN: Lisa Stewart was seen today for cyst.  Diagnoses and all orders for this visit:  Skin infection -     sulfamethoxazole-trimethoprim (BACTRIM DS,SEPTRA DS) 800-160 MG tablet; Take 1 tablet by mouth 2 (two) times daily.  Need for prophylactic vaccination and inoculation against influenza -     Flu  Vaccine QUAD 36+ mos IM  Abscess of axilla, right -     sulfamethoxazole-trimethoprim (BACTRIM DS,SEPTRA DS) 800-160 MG tablet; Take 1 tablet by mouth 2 (two) times daily.    Patient Instructions       IF you received an x-ray today, you will receive an invoice from [facility]. Please contact [facility] at [phone] with questions or concerns regarding your invoice.   IF you received labwork today, you will receive an invoice from [facility]. Please contact [facility] at [phone] with questions or concerns regarding your invoice.   Our billing staff will not be able to  assist you with questions regarding bills from these companies.  You will be contacted with the lab results as soon as they are available. The fastest way to get your results is to activate your My Chart account. Instructions are located on the last page of this paperwork. If you have not heard from [place] regarding the results in 2 weeks, please contact this office.     Skin Abscess A skin abscess is an infected area on or under your skin that contains pus and other material. An abscess can happen almost anywhere on your body. Some abscesses break open (rupture) on their own. Most continue to get worse unless they are treated. The infection can spread deeper into the body and into your blood, which can make you feel sick. Treatment usually involves draining the abscess. Follow these instructions at home: Abscess Care  If you have an abscess that has not drained, place a warm, clean, wet washcloth over the abscess several times a day. Do this as told by your doctor.  Follow instructions from your doctor about how to take care of your abscess. Make sure you: ? Cover the abscess with a bandage (dressing). ? Change your bandage or gauze as told by your doctor. ? Wash your hands with soap and water before you change the bandage or gauze. If you cannot use soap and water, use hand sanitizer.  Check your abscess every day for signs that the infection is getting worse. Check for: ? More redness, swelling, or pain. ? More fluid or blood. ? Warmth. ? More pus or a bad smell. Medicines   Take over-the-counter and prescription medicines only as told by your doctor.  If you were prescribed an antibiotic medicine, take it as told by your doctor. Do not stop taking the antibiotic even if you start to feel better. General instructions  To avoid spreading the infection: ? Do not share personal care items, towels, or hot tubs with others. ? Avoid making skin-to-skin contact with other people.  Keep all  follow-up visits as told by your doctor. This is important. Contact a doctor if:  You have more redness, swelling, or pain around your abscess.  You have more fluid or blood coming from your abscess.  Your abscess feels warm when you touch it.  You have more pus or a bad smell coming from your abscess.  You have a fever.  Your muscles ache.  You have chills.  You feel sick. Get help right away if:  You have very bad (severe) pain.  You see red streaks on your skin spreading away from the abscess. This information is not intended to replace advice given to you by your health care provider. Make sure you discuss any questions you have with your health care provider. Document Released: 04/22/2008 Document Revised: 06/30/2016 Document Reviewed: 09/13/2015 Elsevier Interactive Patient Education  [employer].  Agustina Caroli, MD [facility]

## 2017-09-29 ENCOUNTER — Telehealth: Payer: Self-pay | Admitting: Emergency Medicine

## 2017-09-29 NOTE — Telephone Encounter (Signed)
Refill of Antibiotic

## 2017-09-29 NOTE — Telephone Encounter (Signed)
Copied from CRM [phone]. Topic: Quick Communication - See Telephone Encounter >> Sep 29, 2017  2:01 PM Clack, Princella PellegriniJessica D wrote: CRM for notification. See Telephone encounter for: Pt is requesting a med refill for her sulfamethoxazole-trimethoprim (BACTRIM DS,SEPTRA DS) 800-160 MG   09/29/17.

## 2017-09-30 ENCOUNTER — Telehealth: Payer: Self-pay | Admitting: General Practice

## 2017-09-30 ENCOUNTER — Other Ambulatory Visit: Payer: Self-pay

## 2017-09-30 ENCOUNTER — Ambulatory Visit: Payer: 59 | Admitting: Emergency Medicine

## 2017-09-30 ENCOUNTER — Encounter: Payer: Self-pay | Admitting: Emergency Medicine

## 2017-09-30 VITALS — BP 90/68 | HR 62 | Temp 98.5°F | Resp 16 | Ht 63.5 in | Wt 123.6 lb

## 2017-09-30 DIAGNOSIS — Z0189 Encounter for other specified special examinations: Secondary | ICD-10-CM

## 2017-09-30 DIAGNOSIS — Z7689 Persons encountering health services in other specified circumstances: Secondary | ICD-10-CM

## 2017-09-30 DIAGNOSIS — L02411 Cutaneous abscess of right axilla: Secondary | ICD-10-CM | POA: Diagnosis not present

## 2017-09-30 MED ORDER — SULFAMETHOXAZOLE-TRIMETHOPRIM 800-160 MG PO TABS: 1.0000 | ORAL_TABLET | Freq: Two times a day (BID) | ORAL | 0 refills | Status: AC

## 2017-09-30 NOTE — Progress Notes (Signed)
Lisa Stewart 26 y.o.   Chief Complaint  Patient presents with  . Mass    abcess Right underarm x 4-5 days    HISTORY OF PRESENT ILLNESS: This is a 26 y.o. female complaining of right axillary abscess.  HPI   Prior to Admission medications   Medication Sig Start Date End Date Taking? Authorizing Provider  etonogestrel (NEXPLANON) 68 MG IMPL implant 1 each by Subdermal route once.    [provider]  HYDROcodone-acetaminophen (NORCO) 5-325 MG tablet Take 1 tablet by mouth every 6 (six) hours as needed for moderate pain. May increase to 7.5mg  q 6 hrs. Max dose 10mg  q 6 hrs. Patient not taking: Reported on 07/22/2017 04/21/17   McVey, Madelaine BhatElizabeth Whitney, PA-C  sulfamethoxazole-trimethoprim (BACTRIM DS,SEPTRA DS) 800-160 MG tablet Take 1 tablet 2 (two) times daily for 7 days by mouth. 09/30/17 10/07/17  Georgina QuintSagardia, Illyria Sobocinski Jose, MD  triamcinolone cream (KENALOG) 0.1 % Apply 1 application topically 2 (two) times daily. Use no longer than 2 weeks Patient not taking: Reported on 09/30/2017 06/09/17   Trena PlattEnglish, Stephanie D, PA    No Known Allergies  Patient Active Problem List   Diagnosis Date Noted  . Need for prophylactic vaccination and inoculation against influenza 07/22/2017  . Skin infection 07/22/2017  . Abscess of axilla, right 04/26/2017  . Abscess of calf 04/26/2017    No past medical history on file.  Past Surgical History:  Procedure Laterality Date  . TONSILLECTOMY Bilateral   . WISDOM TOOTH EXTRACTION      Social History   Socioeconomic History  . Marital status: Single    Spouse name: Not on file  . Number of children: Not on file  . Years of education: Not on file  . Highest education level: Not on file  Social Needs  . Financial resource strain: Not on file  . Food insecurity - worry: Not on file  . Food insecurity - inability: Not on file  . Transportation needs - medical: Not on file  . Transportation needs - non-medical: Not on file  Occupational  History  . Not on file  Tobacco Use  . Smoking status: Never Smoker  . Smokeless tobacco: Never Used  Substance and Sexual Activity  . Alcohol use: No  . Drug use: No  . Sexual activity: No  Other Topics Concern  . Not on file  Social History Narrative  . Not on file    Family History  Problem Relation Age of Onset  . Cancer Maternal Grandmother   . Cancer Paternal Grandmother      Review of Systems  Constitutional: Negative.  Negative for chills and fever.  Respiratory: Negative for cough and shortness of breath.   Cardiovascular: Negative for chest pain.  Gastrointestinal: Negative for nausea and vomiting.  Neurological: Negative for dizziness and headaches.  All other systems reviewed and are negative.    Vitals:   09/30/17 1619  BP: 90/68  Pulse: 62  Resp: 16  Temp: 98.5 F (36.9 C)  SpO2: 100%     Physical Exam  Constitutional: She is oriented to person, place, and time. She appears well-developed and well-nourished.  HENT:  Head: Normocephalic and atraumatic.  Eyes: Pupils are equal, round, and reactive to light.  Neck: Normal range of motion.  Cardiovascular: Normal rate.  Pulmonary/Chest: Effort normal.  Musculoskeletal: Normal range of motion.  Neurological: She is alert and oriented to person, place, and time. No sensory deficit. She exhibits normal muscle tone.  Skin: Skin is  warm and dry. Capillary refill takes less than 2 seconds.  Right axilla: +tender fluctuating abscess with erythema  Psychiatric: She has a normal mood and affect. Her behavior is normal.  Vitals reviewed.  PROCEDURE NOTE: I&D of Abscess Verbal consent obtained. Local anesthesia with 5cc of 2% lidocaine with Epi. Site cleansed with Betadine.  Incision of 1.5cm was made using a 11 blade, discharge of moderate amounts of pus and serosanguinous fluid. Cultured. Wound cavity was explored with curved hemostats and packed with 1/4" plain packing. Cleansed and dressed. After care  instructions provided. Patient to return to clinic on 11/16 for reevaluation/repacking.       ASSESSMENT & PLAN: [name] was seen today for mass.  Diagnoses and all orders for this visit:  Abscess of axilla, right -     WOUND CULTURE  Encounter for incision and drainage procedure  Other orders -     sulfamethoxazole-trimethoprim (BACTRIM DS,SEPTRA DS) 800-160 MG tablet; Take 1 tablet 2 (two) times daily for 7 days by mouth.    Patient Instructions       IF you received an x-ray today, you will receive an invoice from [facility]. Please contact [facility] at [phone] with questions or concerns regarding your invoice.   IF you received labwork today, you will receive an invoice from [facility]. Please contact [facility] at [phone] with questions or concerns regarding your invoice.   Our billing staff will not be able to assist you with questions regarding bills from these companies.  You will be contacted with the lab results as soon as they are available. The fastest way to get your results is to activate your My Chart account. Instructions are located on the last page of this paperwork. If you have not heard from us regarding the results in 2 weeks, please contact this office.     Skin Abscess A skin abscess is an infected area on or under your skin that contains pus and other material. An abscess can happen almost anywhere on your body. Some abscesses break open (rupture) on their own. Most continue to get worse unless they are treated. The infection can spread deeper into the body and into your blood, which can make you feel sick. Treatment usually involves draining the abscess. Follow these instructions at home: Abscess Care  If you have an abscess that has not drained, place a warm, clean, wet washcloth over the abscess several times a day. Do this as told by your doctor.  Follow instructions from your doctor about how to take care of your  abscess. Make sure you: ? Cover the abscess with a bandage (dressing). ? Change your bandage or gauze as told by your doctor. ? Wash your hands with soap and water before you change the bandage or gauze. If you cannot use soap and water, use hand sanitizer.  Check your abscess every day for signs that the infection is getting worse. Check for: ? More redness, swelling, or pain. ? More fluid or blood. ? Warmth. ? More pus or a bad smell. Medicines   Take over-the-counter and prescription medicines only as told by your doctor.  If you were prescribed an antibiotic medicine, take it as told by your doctor. Do not stop taking the antibiotic even if you start to feel better. General instructions  To avoid spreading the infection: ? Do not share personal care items, towels, or hot tubs with others. ? Avoid making skin-to-skin contact with other people.  Keep all follow-up visits as  told by your doctor. This is important. Contact a doctor if:  You have more redness, swelling, or pain around your abscess.  You have more fluid or blood coming from your abscess.  Your abscess feels warm when you touch it.  You have more pus or a bad smell coming from your abscess.  You have a fever.  Your muscles ache.  You have chills.  You feel sick. Get help right away if:  You have very bad (severe) pain.  You see red streaks on your skin spreading away from the abscess. This information is not intended to replace advice given to you by your health care provider. Make sure you discuss any questions you have with your health care provider. Document Released: 04/22/2008 Document Revised: 06/30/2016 Document Reviewed: 09/13/2015 Elsevier Interactive Patient Education  2018 Elsevier Inc.      Edwina Barth, MD [facility]

## 2017-09-30 NOTE — Telephone Encounter (Signed)
Patient has an appointment tomorrow  Will decide on medication then

## 2017-09-30 NOTE — Telephone Encounter (Signed)
Copied from CRM [phone]. Topic: Quick Communication - See Telephone Encounter >> Sep 30, 2017 12:52 PM Everardo PacificMoton, Arli Bree, [place] wrote: CRM for notification. See Telephone encounter for: Patient called because she had a weeks supply of the Sulfamethoxazide-trimethoprim and would like to  know if she could have a refill of this medication. If someone could give her a call back please  09/30/17.

## 2017-09-30 NOTE — Telephone Encounter (Signed)
Returned pt. Call. States she has had this on-going difficulty with an abscess and is requesting a refill of her antibiotic. States she has also made an appointment for tomorrow.

## 2017-09-30 NOTE — Patient Instructions (Addendum)
     IF you received an x-ray today, you will receive an invoice from [facility]. Please contact [facility] at [phone] with questions or concerns regarding your invoice.   IF you received labwork today, you will receive an invoice from [facility]. Please contact [facility] at [phone] with questions or concerns regarding your invoice.   Our billing staff will not be able to assist you with questions regarding bills from these companies.  You will be contacted with the lab results as soon as they are available. The fastest way to get your results is to activate your My Chart account. Instructions are located on the last page of this paperwork. If you have not heard from us regarding the results in 2 weeks, please contact this office.     Skin Abscess A skin abscess is an infected area on or under your skin that contains pus and other material. An abscess can happen almost anywhere on your body. Some abscesses break open (rupture) on their own. Most continue to get worse unless they are treated. The infection can spread deeper into the body and into your blood, which can make you feel sick. Treatment usually involves draining the abscess. Follow these instructions at home: Abscess Care  If you have an abscess that has not drained, place a warm, clean, wet washcloth over the abscess several times a day. Do this as told by your doctor.  Follow instructions from your doctor about how to take care of your abscess. Make sure you: ? Cover the abscess with a bandage (dressing). ? Change your bandage or gauze as told by your doctor. ? Wash your hands with soap and water before you change the bandage or gauze. If you cannot use soap and water, use hand sanitizer.  Check your abscess every day for signs that the infection is getting worse. Check for: ? More redness, swelling, or pain. ? More fluid or blood. ? Warmth. ? More pus or a bad smell. Medicines   Take  over-the-counter and prescription medicines only as told by your doctor.  If you were prescribed an antibiotic medicine, take it as told by your doctor. Do not stop taking the antibiotic even if you start to feel better. General instructions  To avoid spreading the infection: ? Do not share personal care items, towels, or hot tubs with others. ? Avoid making skin-to-skin contact with other people.  Keep all follow-up visits as told by your doctor. This is important. Contact a doctor if:  You have more redness, swelling, or pain around your abscess.  You have more fluid or blood coming from your abscess.  Your abscess feels warm when you touch it.  You have more pus or a bad smell coming from your abscess.  You have a fever.  Your muscles ache.  You have chills.  You feel sick. Get help right away if:  You have very bad (severe) pain.  You see red streaks on your skin spreading away from the abscess. This information is not intended to replace advice given to you by your health care provider. Make sure you discuss any questions you have with your health care provider. Document Released: 04/22/2008 Document Revised: 06/30/2016 Document Reviewed: 09/13/2015 Elsevier Interactive Patient Education  2018 Elsevier Inc.  

## 2017-10-01 ENCOUNTER — Ambulatory Visit: Payer: 59 | Admitting: Emergency Medicine

## 2017-10-01 NOTE — Telephone Encounter (Signed)
Pt seen yesterday with new rx sent.

## 2017-10-02 LAB — WOUND CULTURE

## 2017-10-03 ENCOUNTER — Encounter: Payer: Self-pay | Admitting: Emergency Medicine

## 2017-10-03 ENCOUNTER — Other Ambulatory Visit: Payer: Self-pay

## 2017-10-03 ENCOUNTER — Ambulatory Visit: Payer: 59 | Admitting: Emergency Medicine

## 2017-10-03 VITALS — BP 90/56 | HR 56 | Temp 98.3°F | Resp 16 | Ht 63.5 in | Wt 124.4 lb

## 2017-10-03 DIAGNOSIS — A4902 Methicillin resistant Staphylococcus aureus infection, unspecified site: Secondary | ICD-10-CM | POA: Insufficient documentation

## 2017-10-03 DIAGNOSIS — L02411 Cutaneous abscess of right axilla: Secondary | ICD-10-CM

## 2017-10-03 NOTE — Patient Instructions (Addendum)
Continue and finish antibiotic. Continue local wound care. Return if worse.    IF you received an x-ray today, you will receive an invoice from [facility]. Please contact [facility] at [phone] with questions or concerns regarding your invoice.   IF you received labwork today, you will receive an invoice from [facility]. Please contact [facility] at [phone] with questions or concerns regarding your invoice.   Our billing staff will not be able to assist you with questions regarding bills from these companies.  You will be contacted with the lab results as soon as they are available. The fastest way to get your results is to activate your My Chart account. Instructions are located on the last page of this paperwork. If you have not heard from us regarding the results in 2 weeks, please contact this office.     MRSA Infection, Adult MRSA stands for methicillin-resistant Staphylococcus aureus. This type of infection is caused by Staphylococcus aureus bacteria that are no longer affected by the medicines used to kill them (drug resistant). Staphylococcus (staph) bacteria are normally found on the skin or in the nose of healthy people. In most cases, these bacteria do not cause infection. But if these resistant bacteria enter your body through a cut or sore, they can cause a serious infection on your skin or in other parts of your body. There is a slight chance that the staph on your skin or in your nose is MRSA. There are two types of MRSA infections:  Hospital-acquired MRSA is bacteria that you get in the hospital.  Community-acquired MRSA is bacteria that you get somewhere other than in a hospital.  What increases the risk? Hospital-acquired MRSA is more common. You could be at risk for this infection if you are in the hospital and you:  Have surgery or a procedure.  Have an IV access or a catheter tube placed in your body.  Have weak resistance to germs  (weakened immune system).  Are elderly.  Are on kidney dialysis.  You could be at risk for community-acquired MRSA if you have a break in your skin and come into contact with MRSA. This may happen if you:  Play sports where there is skin-to-skin contact.  Live in a crowded setting, like a dormitory or a [employer] barr[employer].  Share towels, razors, or sports equipment with other people.  What are the signs or symptoms? Symptoms of hospital-acquired MRSA depend on where MRSA has spread. Symptoms may include:  Wound infection.  Skin infection.  Rash.  Pneumonia.  Fever and chills.  Difficulty breathing.  Chest pain.  Community-acquired MRSA is most likely to start as a scratch or cut that becomes infected. Symptoms may include:  A pus-filled pimple.  A boil on your skin.  Pus draining from your skin.  A sore (abscess) under your skin or somewhere in your body.  Fever with or without chills.  How is this diagnosed? The diagnosis of MRSA is made by taking a sample from an infected area and sending it to a lab for testing. A lab technician can grow (culture) MRSA and check it under a microscope. The cultured MRSA can be tested to see which type of antibiotic medicine will work to treat it. Newer tests can identify MRSA more quickly by testing bacteria samples for MRSA genes. Your health care provider can diagnose MRSA using samples from:  Cuts or wounds in infected areas.  Nasal swabs.  Saliva or cough specimens from deep in the lungs (sputum).  Urine.  Blood.  You may also have:  Imaging studies (such as X-ray or MRI) to check if the infection has spread to the lungs, bones, or joints.  A culture and sensitivity test of blood or fluids from inside the joints.  How is this treated? Treatment depends on how severe, deep, or extensive the infection is. Very bad infections may require a hospital stay.  Some skin infections, such as a small boil or sore (abscess),  may be treated by draining pus from the site of the infection.  More extensive surgery to drain pus may be necessary for deeper or more widespread soft tissue infections.  You may then have to take antibiotic medicine given by mouth or through a vein. You may start antibiotic treatment right away or after testing can be done to see what antibiotic medicine should be used.  Follow these instructions at home:  Take your antibiotics as directed by your health care provider. Take the medicine as prescribed until it is finished.  Avoid close contact with those around you as much as possible. Do not use towels, razors, toothbrushes, bedding, or other items that will be used by others.  Wash your hands frequently for 15 seconds with soap and water. Dry your hands with a clean or disposable towel.  When you are not able to wash your hands, use hand sanitizer that is more than 60 percent alcohol.  Wash towels, sheets, or clothes in the washing machine with detergent and hot water. Dry them in a hot dryer.  Follow your health care provider's instructions for wound care. Wash your hands before and after changing your bandages.  Always shower after exercising.  Keep all cuts and scrapes clean and covered with a bandage.  Be sure to tell all your health care providers that you have MRSA so they are aware of your infection. Contact a health care provider if:  You have a cut, scrape, pimple, or boil that becomes red, swollen, or painful or has pus in it.  You have pus draining from your skin.  You have an abscess under your skin or somewhere in your body. Get help right away if:  You have symptoms of a skin infection with a fever or chills.  You have trouble breathing.  You have chest pain.  You have a skin wound and you become nauseous or start vomiting. This information is not intended to replace advice given to you by your health care provider. Make sure you discuss any questions you  have with your health care provider. Document Released: 11/04/2005 Document Revised: 04/11/2016 Document Reviewed: 08/27/2013 Elsevier Interactive Patient Education  2017 [employer]

## 2017-10-03 NOTE — Progress Notes (Signed)
Lisa Stewart 26 y.o.   Chief Complaint  Patient presents with  . Follow-up    RIGHT axilla abcess    HISTORY OF PRESENT ILLNESS: This is a 26 y.o. female here for follow up of right axillary abscess drained 3 days ago; culture positive for MRSA; taking Bactrim DS. Doing well; no complaints.  HPI   Prior to Admission medications   Medication Sig Start Date End Date Taking? Authorizing Provider  sulfamethoxazole-trimethoprim (BACTRIM DS,SEPTRA DS) 800-160 MG tablet Take 1 tablet 2 (two) times daily for 7 days by mouth. 09/30/17 10/07/17 Yes Raylyn Carton, Eilleen KempfMiguel Jose, MD  etonogestrel (NEXPLANON) 68 MG IMPL implant 1 each by Subdermal route once.    [provider]  HYDROcodone-acetaminophen (NORCO) 5-325 MG tablet Take 1 tablet by mouth every 6 (six) hours as needed for moderate pain. May increase to 7.5mg  q 6 hrs. Max dose 10mg  q 6 hrs. Patient not taking: Reported on 07/22/2017 04/21/17   McVey, Madelaine BhatElizabeth Whitney, PA-C  triamcinolone cream (KENALOG) 0.1 % Apply 1 application topically 2 (two) times daily. Use no longer than 2 weeks Patient not taking: Reported on 09/30/2017 06/09/17   Trena PlattEnglish, Stephanie D, PA    No Known Allergies  Patient Active Problem List   Diagnosis Date Noted  . Need for prophylactic vaccination and inoculation against influenza 07/22/2017  . Skin infection 07/22/2017  . Abscess of axilla, right 04/26/2017  . Abscess of calf 04/26/2017    No past medical history on file.  Past Surgical History:  Procedure Laterality Date  . TONSILLECTOMY Bilateral   . WISDOM TOOTH EXTRACTION      Social History   Socioeconomic History  . Marital status: Single    Spouse name: Not on file  . Number of children: Not on file  . Years of education: Not on file  . Highest education level: Not on file  Social Needs  . Financial resource strain: Not on file  . Food insecurity - worry: Not on file  . Food insecurity - inability: Not on file  . Transportation  needs - medical: Not on file  . Transportation needs - non-medical: Not on file  Occupational History  . Not on file  Tobacco Use  . Smoking status: Never Smoker  . Smokeless tobacco: Never Used  Substance and Sexual Activity  . Alcohol use: No  . Drug use: No  . Sexual activity: No  Other Topics Concern  . Not on file  Social History Narrative  . Not on file    Family History  Problem Relation Age of Onset  . Cancer Maternal Grandmother   . Cancer Paternal Grandmother      Review of Systems  Constitutional: Negative for chills and fever.  HENT: Negative for sore throat.   Gastrointestinal: Negative for nausea and vomiting.  Skin: Negative for rash.  Neurological: Negative for dizziness and headaches.  All other systems reviewed and are negative.   Vitals:   10/03/17 0940  BP: (!) 90/56  Pulse: (!) 56  Resp: 16  Temp: 98.3 F (36.8 C)  SpO2: 100%    Physical Exam  Constitutional: She is oriented to person, place, and time. She appears well-developed and well-nourished.  HENT:  Head: Normocephalic.  Cardiovascular: Normal rate.  Pulmonary/Chest: Effort normal.  Neurological: She is alert and oriented to person, place, and time.  Skin:  Right axilla: packing in place; no erythema; looks better; packing removed; minimal drainage.  Psychiatric: She has a normal mood and affect. Her behavior  is normal.  Vitals reviewed.    ASSESSMENT & PLAN: Lisa Stewart was seen today for follow-up.  Diagnoses and all orders for this visit:  Abscess of axilla, right  MRSA infection    Patient Instructions    Continue and finish antibiotic. Return if worse.   IF you received an x-ray today, you will receive an invoice from [facility]. Please contact [facility] at [phone] with questions or concerns regarding your invoice.   IF you received labwork today, you will receive an invoice from [facility]. Please contact [facility] at [phone] with  questions or concerns regarding your invoice.   Our billing staff will not be able to assist you with questions regarding bills from these companies.  You will be contacted with the lab results as soon as they are available. The fastest way to get your results is to activate your My Chart account. Instructions are located on the last page of this paperwork. If you have not heard from [place] regarding the results in 2 weeks, please contact this office.     MRSA Infection, Adult MRSA stands for methicillin-resistant Staphylococcus aureus. This type of infection is caused by Staphylococcus aureus bacteria that are no longer affected by the medicines used to kill them (drug resistant). Staphylococcus (staph) bacteria are normally found on the skin or in the nose of healthy people. In most cases, these bacteria do not cause infection. But if these resistant bacteria enter your body through a cut or sore, they can cause a serious infection on your skin or in other parts of your body. There is a slight chance that the staph on your skin or in your nose is MRSA. There are two types of MRSA infections:  Hospital-acquired MRSA is bacteria that you get in the hospital.  Community-acquired MRSA is bacteria that you get somewhere other than in a hospital.  What increases the risk? Hospital-acquired MRSA is more common. You could be at risk for this infection if you are in the hospital and you:  Have surgery or a procedure.  Have an IV access or a catheter tube placed in your body.  Have weak resistance to germs (weakened immune system).  Are elderly.  Are on kidney dialysis.  You could be at risk for community-acquired MRSA if you have a break in your skin and come into contact with MRSA. This may happen if you:  Play sports where there is skin-to-skin contact.  Live in a crowded setting, like a dormitory or a [employer].  Share towels, razors, or sports equipment with other people.  What  are the signs or symptoms? Symptoms of hospital-acquired MRSA depend on where MRSA has spread. Symptoms may include:  Wound infection.  Skin infection.  Rash.  Pneumonia.  Fever and chills.  Difficulty breathing.  Chest pain.  Community-acquired MRSA is most likely to start as a scratch or cut that becomes infected. Symptoms may include:  A pus-filled pimple.  A boil on your skin.  Pus draining from your skin.  A sore (abscess) under your skin or somewhere in your body.  Fever with or without chills.  How is this diagnosed? The diagnosis of MRSA is made by taking a sample from an infected area and sending it to a lab for testing. A lab technician can grow (culture) MRSA and check it under a microscope. The cultured MRSA can be tested to see which type of antibiotic medicine will work to treat it. Newer tests can identify MRSA more quickly by  testing bacteria samples for MRSA genes. Your health care provider can diagnose MRSA using samples from:  Cuts or wounds in infected areas.  Nasal swabs.  Saliva or cough specimens from deep in the lungs (sputum).  Urine.  Blood.  You may also have:  Imaging studies (such as X-ray or MRI) to check if the infection has spread to the lungs, bones, or joints.  A culture and sensitivity test of blood or fluids from inside the joints.  How is this treated? Treatment depends on how severe, deep, or extensive the infection is. Very bad infections may require a hospital stay.  Some skin infections, such as a small boil or sore (abscess), may be treated by draining pus from the site of the infection.  More extensive surgery to drain pus may be necessary for deeper or more widespread soft tissue infections.  You may then have to take antibiotic medicine given by mouth or through a vein. You may start antibiotic treatment right away or after testing can be done to see what antibiotic medicine should be used.  Follow these  instructions at home:  Take your antibiotics as directed by your health care provider. Take the medicine as prescribed until it is finished.  Avoid close contact with those around you as much as possible. Do not use towels, razors, toothbrushes, bedding, or other items that will be used by others.  Wash your hands frequently for 15 seconds with soap and water. Dry your hands with a clean or disposable towel.  When you are not able to wash your hands, use hand sanitizer that is more than 60 percent alcohol.  Wash towels, sheets, or clothes in the washing machine with detergent and hot water. Dry them in a hot dryer.  Follow your health care provider's instructions for wound care. Wash your hands before and after changing your bandages.  Always shower after exercising.  Keep all cuts and scrapes clean and covered with a bandage.  Be sure to tell all your health care providers that you have MRSA so they are aware of your infection. Contact a health care provider if:  You have a cut, scrape, pimple, or boil that becomes red, swollen, or painful or has pus in it.  You have pus draining from your skin.  You have an abscess under your skin or somewhere in your body. Get help right away if:  You have symptoms of a skin infection with a fever or chills.  You have trouble breathing.  You have chest pain.  You have a skin wound and you become nauseous or start vomiting. This information is not intended to replace advice given to you by your health care provider. Make sure you discuss any questions you have with your health care provider. Document Released: 11/04/2005 Document Revised: 04/11/2016 Document Reviewed: 08/27/2013 Elsevier Interactive Patient Education  2017 [employer]       Edwina BarthMiguel Temika Sutphin, MD [facility]

## 2018-02-25 ENCOUNTER — Encounter: Payer: Self-pay | Admitting: Physician Assistant

## 2018-12-14 ENCOUNTER — Ambulatory Visit: Payer: Self-pay | Admitting: *Deleted

## 2018-12-14 NOTE — Telephone Encounter (Addendum)
Per inititial encounter the pt would like a 1600 appointment in the office today; there is no availability; attempted to contact pt regarding symptoms; left message on voicemail [phone]; if the pt wants to be seen today, urgent care or ED are options; if pt calls back please discuss options with pt.

## 2018-12-15 ENCOUNTER — Ambulatory Visit: Payer: 59 | Admitting: Family Medicine

## 2018-12-15 ENCOUNTER — Other Ambulatory Visit: Payer: Self-pay

## 2018-12-15 ENCOUNTER — Encounter: Payer: Self-pay | Admitting: Family Medicine

## 2018-12-15 ENCOUNTER — Ambulatory Visit (INDEPENDENT_AMBULATORY_CARE_PROVIDER_SITE_OTHER): Payer: 59

## 2018-12-15 VITALS — BP 106/66 | HR 64 | Temp 97.7°F | Resp 14 | Ht 64.0 in | Wt 124.0 lb

## 2018-12-15 DIAGNOSIS — M25461 Effusion, right knee: Secondary | ICD-10-CM

## 2018-12-15 DIAGNOSIS — M25561 Pain in right knee: Secondary | ICD-10-CM

## 2018-12-15 DIAGNOSIS — L309 Dermatitis, unspecified: Secondary | ICD-10-CM

## 2018-12-15 MED ORDER — TRIAMCINOLONE ACETONIDE 0.1 % EX CREA: 1.0000 "application " | TOPICAL_CREAM | Freq: Two times a day (BID) | CUTANEOUS | 1 refills | Status: DC

## 2018-12-15 MED ORDER — HYDROCODONE-ACETAMINOPHEN 5-325 MG PO TABS: 1.0000 | ORAL_TABLET | Freq: Four times a day (QID) | ORAL | 0 refills | Status: DC | PRN

## 2018-12-15 NOTE — Progress Notes (Signed)
Subjective:    Patient ID: Lisa Stewart, female    DOB: 10-31-1991, 28 y.o.   MRN: [id number]  HPI [name] is a 28 y.o. female Presents today for: Chief Complaint  Patient presents with  . Knee Pain    was on a skiing trip and fell on rt knee, heard a pop when fell  . Medication Refill    would like to get a refill on eczema cream   Skiing in [place].  this past weekend. 1st time on skis. Trying to turn, fell, ski got caught, R knee turned inward, felt/heard pop. Trouble with weight bearing. Pop again with trying to put on boot. Noticed immediate swelling - worse in few days.  Still some trouble with weight bearing, bending and straightening.   No ankle/foot/hip pain.   Tx: ice, elevate, tylenol, ACE bandage, CBD cream.   Prior R knee issues: Tore meniscus ?medial in high school volleyball, [facility].  Knee had been ok before recent injury.   R hand eczema : Out of steroid cream. Worse past few months on knuckles.   Patient Active Problem List   Diagnosis Date Noted  . MRSA infection 10/03/2017  . Need for prophylactic vaccination and inoculation against influenza 07/22/2017  . Skin infection 07/22/2017  . Abscess of axilla, right 04/26/2017  . Abscess of calf 04/26/2017   No past medical history on file. Past Surgical History:  Procedure Laterality Date  . TONSILLECTOMY Bilateral   . WISDOM TOOTH EXTRACTION     No Known Allergies Prior to Admission medications   Medication Sig Start Date End Date Taking? Authorizing Provider  Levonorgestrel (KYLEENA) 19.5 MG IUD by Intrauterine route.   Yes [provider]  triamcinolone cream (KENALOG) 0.1 % Apply 1 application topically 2 (two) times daily. Use no longer than 2 weeks 06/09/17  Yes Garnetta Buddy, PA   Social History   Socioeconomic History  . Marital status: Single    Spouse name: Not on file  . Number of children: Not on file  . Years of education: Not on file  . Highest  education level: Not on file  Occupational History  . Not on file  Social Needs  . Financial resource strain: Not on file  . Food insecurity:    Worry: Not on file    Inability: Not on file  . Transportation needs:    Medical: Not on file    Non-medical: Not on file  Tobacco Use  . Smoking status: Never Smoker  . Smokeless tobacco: Never Used  Substance and Sexual Activity  . Alcohol use: No  . Drug use: No  . Sexual activity: Never  Lifestyle  . Physical activity:    Days per week: Not on file    Minutes per session: Not on file  . Stress: Not on file  Relationships  . Social connections:    Talks on phone: Not on file    Gets together: Not on file    Attends religious service: Not on file    Active member of club or organization: Not on file    Attends meetings of clubs or organizations: Not on file    Relationship status: Not on file  . Intimate partner violence:    Fear of current or ex partner: Not on file    Emotionally abused: Not on file    Physically abused: Not on file    Forced sexual activity: Not on file  Other Topics Concern  .  Not on file  Social History Narrative  . Not on file    Review of Systems As in HPI    Objective:   Physical Exam Constitutional:      General: She is not in acute distress.    Appearance: She is well-developed.  HENT:     Head: Normocephalic and atraumatic.  Cardiovascular:     Rate and Rhythm: Normal rate.  Pulmonary:     Effort: Pulmonary effort is normal.  Musculoskeletal:     Right knee: She exhibits decreased range of motion (lacks approx 15 ext, able to achieve 90 flex. ) and effusion. She exhibits no ecchymosis, no deformity, no LCL laxity, normal patellar mobility and no MCL laxity. Tenderness found. Medial joint line tenderness noted.     Right ankle: Normal.       Legs:     Right foot: Normal.  Skin:    Comments: Dry/cracked skin over extensor PIP and DIP joints both hands.  No signs of surrounding  infection  Neurological:     Mental Status: She is alert and oriented to person, place, and time.    Vitals:   12/15/18 1120  BP: 106/66  Pulse: 64  Resp: 14  Temp: 97.7 F (36.5 C)  TempSrc: Oral  SpO2: 98%  Weight: 124 lb (56.2 kg)  Height: 5\' 4"  (1.626 m)      Assessment & Plan:    [name] is a 28 y.o. female Acute pain of right knee - Plan: DG Knee Complete 4 Views Right, HYDROcodone-acetaminophen (NORCO/VICODIN) 5-325 MG tablet, Crutches, Apply knee sleeve, Ambulatory referral to Orthopedic Surgery, CANCELED: Ambulatory referral to Orthopedic Surgery Pain and swelling of right knee - Plan: HYDROcodone-acetaminophen (NORCO/VICODIN) 5-325 MG tablet, Crutches, Apply knee sleeve, Ambulatory referral to Orthopedic Surgery, CANCELED: Ambulatory referral to Orthopedic Surgery  -Acute pain of right knee with date of injury January 25.  Mechanism describes flexed valgus knee and pop concerning for ACL tear.  Previous medial meniscal injury and does localize to medial joint line as well, differential includes secondary medial meniscal tear.  -X-ray without concerns of fracture.  -Placed in hinged knee brace, crutches with nonweightbearing to weight-bear as tolerated or can be decided by Ortho  -Ibuprofen up to 600 mg every 6 hours, hydrocodone if needed for more severe/breakthrough pain.  Potential side effects and risk discussed.  -Refer to orthopedics to decide on MRI/advanced imaging versus arthroscopic evaluation/treatment  Eczema of both hands - Plan: triamcinolone cream (KENALOG) 0.1 %  -Handout given on eczema, lotion multiple times daily, refill triamcinolone as needed up to twice per day.   Meds ordered this encounter  Medications  . HYDROcodone-acetaminophen (NORCO/VICODIN) 5-325 MG tablet    Sig: Take 1 tablet by mouth every 6 (six) hours as needed for moderate pain.    Dispense:  20 tablet    Refill:  0  . triamcinolone cream (KENALOG) 0.1 %    Sig: Apply 1  application topically 2 (two) times daily.    Dispense:  30 g    Refill:  1   Patient Instructions   For rash on hands, okay to use the steroid cream up to twice per day if needed, but also use hydrating lotion such as Eucerin at least 2-3 times per day, especially after washing hands.  Unfortunately your knee exam is concerning for possible ACL tear as well as possible involvement of the medial meniscus again.  For now use the brace, crutches as needed until not having pain  with weightbearing or that can also be discussed with orthopedics.  I will refer you to an orthopedic specialist.  If you have not heard from them in the next week to 10 days, please let me know.  For pain okay to try ibuprofen up to 600 mg every 6 hours as needed with food.  Short course of hydrocodone was prescribed if needed for more severe pain.  Acute Knee Pain, Adult Acute knee pain is sudden and may be caused by damage, swelling, or irritation of the muscles and tissues that support your knee. The injury may result from:  A fall.  An injury to your knee from twisting motions.  A hit to the knee.  Infection. Acute knee pain may go away on its own with time and rest. If it does not, your health care provider may order tests to find the cause of the pain. These may include:  Imaging tests, such as an X-ray, MRI, or ultrasound.  Joint aspiration. In this test, fluid is removed from the knee.  Arthroscopy. In this test, a lighted tube is inserted into the knee and an image is projected onto a TV screen.  Biopsy. In this test, a sample of tissue is removed from the body and studied under a microscope. Follow these instructions at home: Pay attention to any changes in your symptoms. Take these actions to relieve your pain. If you have a knee sleeve or brace:   Wear the sleeve or brace as told by your health care provider. Remove it only as told by your health care provider.  Loosen the sleeve or brace if your  toes tingle, become numb, or turn cold and blue.  Keep the sleeve or brace clean.  If the sleeve or brace is not waterproof: ? Do not let it get wet. ? Cover it with a watertight covering when you take a bath or shower. Activity  Rest your knee.  Do not do things that cause pain or make pain worse.  Avoid high-impact activities or exercises, such as running, jumping rope, or doing jumping jacks.  Work with a physical therapist to make a safe exercise program, as recommended by your health care provider. Do exercises as told by your physical therapist. Managing pain, stiffness, and swelling   If directed, put ice on the knee: ? Put ice in a plastic bag. ? Place a towel between your skin and the bag. ? Leave the ice on for 20 minutes, 2-3 times a day.  If directed, use an elastic bandage to put pressure (compression) on your injured knee. This may control swelling, give support, and help with discomfort. General instructions  Take over-the-counter and prescription medicines only as told by your health care provider.  Raise (elevate) your knee above the level of your heart when you are sitting or lying down.  Sleep with a pillow under your knee.  Do not use any products that contain nicotine or tobacco, such as cigarettes, e-cigarettes, and chewing tobacco. These can delay healing. If you need help quitting, ask your health care provider.  If you are overweight, work with your health care provider and a dietitian to set a weight-loss goal that is healthy and reasonable for you. Extra weight can put pressure on your knee.  Keep all follow-up visits as told by your health care provider. This is important. Contact a health care provider if:  Your knee pain continues, changes, or gets worse.  You have a fever along with knee pain.  Your knee feels warm to the touch.  Your knee buckles or locks up. Get help right away if:  Your knee swells, and the swelling becomes  worse.  You cannot move your knee.  You have severe pain in your knee. Summary  Acute knee pain can be caused by a fall, an injury, an infection, or damage, swelling, or irritation of the tissues that support your knee.  Your health care provider may perform tests to find out the cause of the pain.  Pay attention to any changes in your symptoms. Relieve your pain with rest, medicines, light activity, and use of ice.  Get help if your pain continues or becomes worse, your knee swells, or you cannot move your knee. This information is not intended to replace advice given to you by your health care provider. Make sure you discuss any questions you have with your health care provider. Document Released: 09/01/2007 Document Revised: 04/16/2018 Document Reviewed: 04/16/2018 Elsevier Interactive Patient Education  2019 [employer].   Eczema Eczema is a broad term for a group of skin conditions that cause skin to become rough and inflamed. Each type of eczema has different triggers, symptoms, and treatments. Eczema of any type is usually itchy and symptoms range from mild to severe. Eczema and its symptoms are not spread from person to person (are not contagious). It can appear on different parts of the body at different times. Your eczema may not look the same as someone else's eczema. What are the types of eczema? Atopic dermatitis This is a long-term (chronic) skin disease that keeps coming back (recurring). Usual symptoms are dry skin and small, solid pimples that may swell and leak fluid (weep). Contact dermatitis  This happens when something irritates the skin and causes a rash. The irritation can come from substances that you are allergic to (allergens), such as poison ivy, chemicals, or medicines that were applied to your skin. Dyshidrotic eczema This is a form of eczema on the hands and feet. It shows up as very itchy, fluid-filled blisters. It can affect people of any age, but is  more common before age 28. Hand eczema  This causes very itchy areas of skin on the palms and sides of the hands and fingers. This type of eczema is common in industrial jobs where you may be exposed to many different types of irritants. Lichen simplex chronicus This type of eczema occurs when a person constantly scratches one area of the body. Repeated scratching of the area leads to thickened skin (lichenification). Lichen simplex chronicus can occur along with other types of eczema. It is more common in adults, but may be seen in children as well. Nummular eczema This is a common type of eczema. It has no known cause. It typically causes a red, circular, crusty lesion (plaque) that may be itchy. Scratching may become a habit and can cause bleeding. Nummular eczema occurs most often in people of middle-age or older. It most often affects the hands. Seborrheic dermatitis This is a common skin disease that mainly affects the scalp. It may also affect any oily areas of the body, such as the face, sides of nose, eyebrows, ears, eyelids, and chest. It is marked by small scaling and redness of the skin (erythema). This can affect people of all ages. In infants, this condition is known as Location manager"cradle cap." Stasis dermatitis This is a common skin disease that usually appears on the legs and feet. It most often occurs in people who have a  condition that prevents blood from being pumped through the veins in the legs (chronic venous insufficiency). Stasis dermatitis is a chronic condition that needs long-term management. How is eczema diagnosed? Your health care provider will examine your skin and review your medical history. He or she may also give you skin patch tests. These tests involve taking patches that contain possible allergens and placing them on your back. He or she will then check in a few days to see if an allergic reaction occurred. What are the common treatments? Treatment for eczema is based on the  type of eczema you have. Hydrocortisone steroid medicine can relieve itching quickly and help reduce inflammation. This medicine may be prescribed or obtained over-the-counter, depending on the strength of the medicine that is needed. Follow these instructions at home:  Take over-the-counter and prescription medicines only as told by your health care provider.  Use creams or ointments to moisturize your skin. Do not use lotions.  Learn what triggers or irritates your symptoms. Avoid these things.  Treat symptom flare-ups quickly.  Do not itch your skin. This can make your rash worse.  Keep all follow-up visits as told by your health care provider. This is important. Where to find more information  The American Academy of Dermatology: InfoExam.si  The National Eczema Association: www.nationaleczema.org Contact a health care provider if:  You have serious itching, even with treatment.  You regularly scratch your skin until it bleeds.  Your rash looks different than usual.  Your skin is painful, swollen, or more red than usual.  You have a fever. Summary  There are eight general types of eczema. Each type has different triggers.  Eczema of any type causes itching that may range from mild to severe.  Treatment varies based on the type of eczema you have. Hydrocortisone steroid medicine can help with itching and inflammation.  Protecting your skin is the best way to prevent eczema. Use moisturizers and lotions. Avoid triggers and irritants, and treat flare-ups quickly. This information is not intended to replace advice given to you by your health care provider. Make sure you discuss any questions you have with your health care provider. Document Released: 03/20/2017 Document Revised: 03/20/2017 Document Reviewed: 03/20/2017 Elsevier Interactive Patient Education  [employer].   If you have lab work done today you will be contacted with your lab results within the next 2  weeks.  If you have not heard from [place] then please contact us. The fastest way to get your results is to register for My Chart.   IF you received an x-ray today, you will receive an invoice from [facility]. Please contact [facility] at [phone] with questions or concerns regarding your invoice.   IF you received labwork today, you will receive an invoice from [facility]. Please contact [facility] at [phone] with questions or concerns regarding your invoice.   Our billing staff will not be able to assist you with questions regarding bills from these companies.  You will be contacted with the lab results as soon as they are available. The fastest way to get your results is to activate your My Chart account. Instructions are located on the last page of this paperwork. If you have not heard from [place] regarding the results in 2 weeks, please contact this office.      Signed,   Meredith Staggers, MD [facility] at [facility].  12/15/18 1:40 PM

## 2018-12-15 NOTE — Patient Instructions (Addendum)
For rash on hands, okay to use the steroid cream up to twice per day if needed, but also use hydrating lotion such as Eucerin at least 2-3 times per day, especially after washing hands.  Unfortunately your knee exam is concerning for possible ACL tear as well as possible involvement of the medial meniscus again.  For now use the brace, crutches as needed until not having pain with weightbearing or that can also be discussed with orthopedics.  I will refer you to an orthopedic specialist.  If you have not heard from them in the next week to 10 days, please let me know.  For pain okay to try ibuprofen up to 600 mg every 6 hours as needed with food.  Short course of hydrocodone was prescribed if needed for more severe pain.  Acute Knee Pain, Adult Acute knee pain is sudden and may be caused by damage, swelling, or irritation of the muscles and tissues that support your knee. The injury may result from:  A fall.  An injury to your knee from twisting motions.  A hit to the knee.  Infection. Acute knee pain may go away on its own with time and rest. If it does not, your health care provider may order tests to find the cause of the pain. These may include:  Imaging tests, such as an X-ray, MRI, or ultrasound.  Joint aspiration. In this test, fluid is removed from the knee.  Arthroscopy. In this test, a lighted tube is inserted into the knee and an image is projected onto a TV screen.  Biopsy. In this test, a sample of tissue is removed from the body and studied under a microscope. Follow these instructions at home: Pay attention to any changes in your symptoms. Take these actions to relieve your pain. If you have a knee sleeve or brace:   Wear the sleeve or brace as told by your health care provider. Remove it only as told by your health care provider.  Loosen the sleeve or brace if your toes tingle, become numb, or turn cold and blue.  Keep the sleeve or brace clean.  If the sleeve or  brace is not waterproof: ? Do not let it get wet. ? Cover it with a watertight covering when you take a bath or shower. Activity  Rest your knee.  Do not do things that cause pain or make pain worse.  Avoid high-impact activities or exercises, such as running, jumping rope, or doing jumping jacks.  Work with a physical therapist to make a safe exercise program, as recommended by your health care provider. Do exercises as told by your physical therapist. Managing pain, stiffness, and swelling   If directed, put ice on the knee: ? Put ice in a plastic bag. ? Place a towel between your skin and the bag. ? Leave the ice on for 20 minutes, 2-3 times a day.  If directed, use an elastic bandage to put pressure (compression) on your injured knee. This may control swelling, give support, and help with discomfort. General instructions  Take over-the-counter and prescription medicines only as told by your health care provider.  Raise (elevate) your knee above the level of your heart when you are sitting or lying down.  Sleep with a pillow under your knee.  Do not use any products that contain nicotine or tobacco, such as cigarettes, e-cigarettes, and chewing tobacco. These can delay healing. If you need help quitting, ask your health care provider.  If you are  overweight, work with your health care provider and a dietitian to set a weight-loss goal that is healthy and reasonable for you. Extra weight can put pressure on your knee.  Keep all follow-up visits as told by your health care provider. This is important. Contact a health care provider if:  Your knee pain continues, changes, or gets worse.  You have a fever along with knee pain.  Your knee feels warm to the touch.  Your knee buckles or locks up. Get help right away if:  Your knee swells, and the swelling becomes worse.  You cannot move your knee.  You have severe pain in your knee. Summary  Acute knee pain can be  caused by a fall, an injury, an infection, or damage, swelling, or irritation of the tissues that support your knee.  Your health care provider may perform tests to find out the cause of the pain.  Pay attention to any changes in your symptoms. Relieve your pain with rest, medicines, light activity, and use of ice.  Get help if your pain continues or becomes worse, your knee swells, or you cannot move your knee. This information is not intended to replace advice given to you by your health care provider. Make sure you discuss any questions you have with your health care provider. Document Released: 09/01/2007 Document Revised: 04/16/2018 Document Reviewed: 04/16/2018 Elsevier Interactive Patient Education  2019 Elsevier Inc.   Eczema Eczema is a broad term for a group of skin conditions that cause skin to become rough and inflamed. Each type of eczema has different triggers, symptoms, and treatments. Eczema of any type is usually itchy and symptoms range from mild to severe. Eczema and its symptoms are not spread from person to person (are not contagious). It can appear on different parts of the body at different times. Your eczema may not look the same as someone else's eczema. What are the types of eczema? Atopic dermatitis This is a long-term (chronic) skin disease that keeps coming back (recurring). Usual symptoms are dry skin and small, solid pimples that may swell and leak fluid (weep). Contact dermatitis  This happens when something irritates the skin and causes a rash. The irritation can come from substances that you are allergic to (allergens), such as poison ivy, chemicals, or medicines that were applied to your skin. Dyshidrotic eczema This is a form of eczema on the hands and feet. It shows up as very itchy, fluid-filled blisters. It can affect people of any age, but is more common before age 70. Hand eczema  This causes very itchy areas of skin on the palms and sides of the  hands and fingers. This type of eczema is common in industrial jobs where you may be exposed to many different types of irritants. Lichen simplex chronicus This type of eczema occurs when a person constantly scratches one area of the body. Repeated scratching of the area leads to thickened skin (lichenification). Lichen simplex chronicus can occur along with other types of eczema. It is more common in adults, but may be seen in children as well. Nummular eczema This is a common type of eczema. It has no known cause. It typically causes a red, circular, crusty lesion (plaque) that may be itchy. Scratching may become a habit and can cause bleeding. Nummular eczema occurs most often in people of middle-age or older. It most often affects the hands. Seborrheic dermatitis This is a common skin disease that mainly affects the scalp. It may also affect any  oily areas of the body, such as the face, sides of nose, eyebrows, ears, eyelids, and chest. It is marked by small scaling and redness of the skin (erythema). This can affect people of all ages. In infants, this condition is known as Location manager"cradle cap." Stasis dermatitis This is a common skin disease that usually appears on the legs and feet. It most often occurs in people who have a condition that prevents blood from being pumped through the veins in the legs (chronic venous insufficiency). Stasis dermatitis is a chronic condition that needs long-term management. How is eczema diagnosed? Your health care provider will examine your skin and review your medical history. He or she may also give you skin patch tests. These tests involve taking patches that contain possible allergens and placing them on your back. He or she will then check in a few days to see if an allergic reaction occurred. What are the common treatments? Treatment for eczema is based on the type of eczema you have. Hydrocortisone steroid medicine can relieve itching quickly and help reduce  inflammation. This medicine may be prescribed or obtained over-the-counter, depending on the strength of the medicine that is needed. Follow these instructions at home:  Take over-the-counter and prescription medicines only as told by your health care provider.  Use creams or ointments to moisturize your skin. Do not use lotions.  Learn what triggers or irritates your symptoms. Avoid these things.  Treat symptom flare-ups quickly.  Do not itch your skin. This can make your rash worse.  Keep all follow-up visits as told by your health care provider. This is important. Where to find more information  The American Academy of Dermatology: InfoExam.siwww.aad.org  The National Eczema Association: www.nationaleczema.org Contact a health care provider if:  You have serious itching, even with treatment.  You regularly scratch your skin until it bleeds.  Your rash looks different than usual.  Your skin is painful, swollen, or more red than usual.  You have a fever. Summary  There are eight general types of eczema. Each type has different triggers.  Eczema of any type causes itching that may range from mild to severe.  Treatment varies based on the type of eczema you have. Hydrocortisone steroid medicine can help with itching and inflammation.  Protecting your skin is the best way to prevent eczema. Use moisturizers and lotions. Avoid triggers and irritants, and treat flare-ups quickly. This information is not intended to replace advice given to you by your health care provider. Make sure you discuss any questions you have with your health care provider. Document Released: 03/20/2017 Document Revised: 03/20/2017 Document Reviewed: 03/20/2017 Elsevier Interactive Patient Education  [employer]2019 Elsevier Inc.   If you have lab work done today you will be contacted with your lab results within the next 2 weeks.  If you have not heard from us then please contact us. The fastest way to get your results is  to register for My Chart.   IF you received an x-ray today, you will receive an invoice from [facility]. Please contact [facility] at [phone] with questions or concerns regarding your invoice.   IF you received labwork today, you will receive an invoice from [facility]. Please contact [facility] at [phone] with questions or concerns regarding your invoice.   Our billing staff will not be able to assist you with questions regarding bills from these companies.  You will be contacted with the lab results as soon as they are available. The fastest way to get  your results is to activate your My Chart account. Instructions are located on the last page of this paperwork. If you have not heard from [place] regarding the results in 2 weeks, please contact this office.

## 2018-12-17 DIAGNOSIS — M25561 Pain in right knee: Secondary | ICD-10-CM | POA: Diagnosis not present

## 2018-12-22 DIAGNOSIS — S83511A Sprain of anterior cruciate ligament of right knee, initial encounter: Secondary | ICD-10-CM | POA: Diagnosis not present

## 2018-12-31 DIAGNOSIS — G8918 Other acute postprocedural pain: Secondary | ICD-10-CM | POA: Diagnosis not present

## 2018-12-31 DIAGNOSIS — S83511A Sprain of anterior cruciate ligament of right knee, initial encounter: Secondary | ICD-10-CM | POA: Diagnosis not present

## 2018-12-31 DIAGNOSIS — S83221A Peripheral tear of medial meniscus, current injury, right knee, initial encounter: Secondary | ICD-10-CM | POA: Diagnosis not present

## 2019-01-04 DIAGNOSIS — M25561 Pain in right knee: Secondary | ICD-10-CM | POA: Diagnosis not present

## 2019-01-13 DIAGNOSIS — M25561 Pain in right knee: Secondary | ICD-10-CM | POA: Diagnosis not present

## 2019-01-13 DIAGNOSIS — M25361 Other instability, right knee: Secondary | ICD-10-CM | POA: Diagnosis not present

## 2019-01-13 DIAGNOSIS — Z4789 Encounter for other orthopedic aftercare: Secondary | ICD-10-CM | POA: Diagnosis not present

## 2019-01-15 DIAGNOSIS — M25561 Pain in right knee: Secondary | ICD-10-CM | POA: Diagnosis not present

## 2019-01-15 DIAGNOSIS — M25661 Stiffness of right knee, not elsewhere classified: Secondary | ICD-10-CM | POA: Diagnosis not present

## 2019-01-26 DIAGNOSIS — M25561 Pain in right knee: Secondary | ICD-10-CM | POA: Diagnosis not present

## 2019-01-28 DIAGNOSIS — M25561 Pain in right knee: Secondary | ICD-10-CM | POA: Diagnosis not present

## 2019-02-03 DIAGNOSIS — M25561 Pain in right knee: Secondary | ICD-10-CM | POA: Diagnosis not present

## 2019-02-05 DIAGNOSIS — M25561 Pain in right knee: Secondary | ICD-10-CM | POA: Diagnosis not present

## 2019-02-08 DIAGNOSIS — Z4789 Encounter for other orthopedic aftercare: Secondary | ICD-10-CM | POA: Diagnosis not present

## 2019-02-09 DIAGNOSIS — M25561 Pain in right knee: Secondary | ICD-10-CM | POA: Diagnosis not present

## 2019-02-12 DIAGNOSIS — M25561 Pain in right knee: Secondary | ICD-10-CM | POA: Diagnosis not present

## 2019-02-17 DIAGNOSIS — M25561 Pain in right knee: Secondary | ICD-10-CM | POA: Diagnosis not present

## 2019-02-19 DIAGNOSIS — M25561 Pain in right knee: Secondary | ICD-10-CM | POA: Diagnosis not present

## 2019-02-22 DIAGNOSIS — M25561 Pain in right knee: Secondary | ICD-10-CM | POA: Diagnosis not present

## 2019-02-24 DIAGNOSIS — M25561 Pain in right knee: Secondary | ICD-10-CM | POA: Diagnosis not present

## 2019-03-03 DIAGNOSIS — M25561 Pain in right knee: Secondary | ICD-10-CM | POA: Diagnosis not present

## 2019-03-05 DIAGNOSIS — M25561 Pain in right knee: Secondary | ICD-10-CM | POA: Diagnosis not present

## 2019-03-09 DIAGNOSIS — M25561 Pain in right knee: Secondary | ICD-10-CM | POA: Diagnosis not present

## 2019-03-11 DIAGNOSIS — M25561 Pain in right knee: Secondary | ICD-10-CM | POA: Diagnosis not present

## 2019-03-16 DIAGNOSIS — M25561 Pain in right knee: Secondary | ICD-10-CM | POA: Diagnosis not present

## 2019-03-18 DIAGNOSIS — M25561 Pain in right knee: Secondary | ICD-10-CM | POA: Diagnosis not present

## 2019-03-22 DIAGNOSIS — M25361 Other instability, right knee: Secondary | ICD-10-CM | POA: Diagnosis not present

## 2019-03-22 DIAGNOSIS — M25561 Pain in right knee: Secondary | ICD-10-CM | POA: Diagnosis not present

## 2019-03-22 DIAGNOSIS — Z4789 Encounter for other orthopedic aftercare: Secondary | ICD-10-CM | POA: Diagnosis not present

## 2019-03-24 DIAGNOSIS — M25561 Pain in right knee: Secondary | ICD-10-CM | POA: Diagnosis not present

## 2019-03-29 DIAGNOSIS — M25561 Pain in right knee: Secondary | ICD-10-CM | POA: Diagnosis not present

## 2019-03-31 DIAGNOSIS — M25561 Pain in right knee: Secondary | ICD-10-CM | POA: Diagnosis not present

## 2019-04-05 DIAGNOSIS — M25561 Pain in right knee: Secondary | ICD-10-CM | POA: Diagnosis not present

## 2019-10-05 ENCOUNTER — Other Ambulatory Visit: Payer: Self-pay | Admitting: Cardiology

## 2019-10-05 DIAGNOSIS — Z20822 Contact with and (suspected) exposure to covid-19: Secondary | ICD-10-CM

## 2019-10-07 LAB — NOVEL CORONAVIRUS, NAA: SARS-CoV-2, NAA: NOT DETECTED

## 2019-12-23 ENCOUNTER — Ambulatory Visit: Payer: Self-pay | Attending: Internal Medicine

## 2020-12-07 IMAGING — DX DG KNEE COMPLETE 4+V*R*
5 series · 5 of 5 positions shown · non-contrast
Comparison: None.

CLINICAL DATA: Acute right knee pain after injury 4 days ago.

EXAM:
RIGHT KNEE - COMPLETE 4+ VIEW

[knee ap]
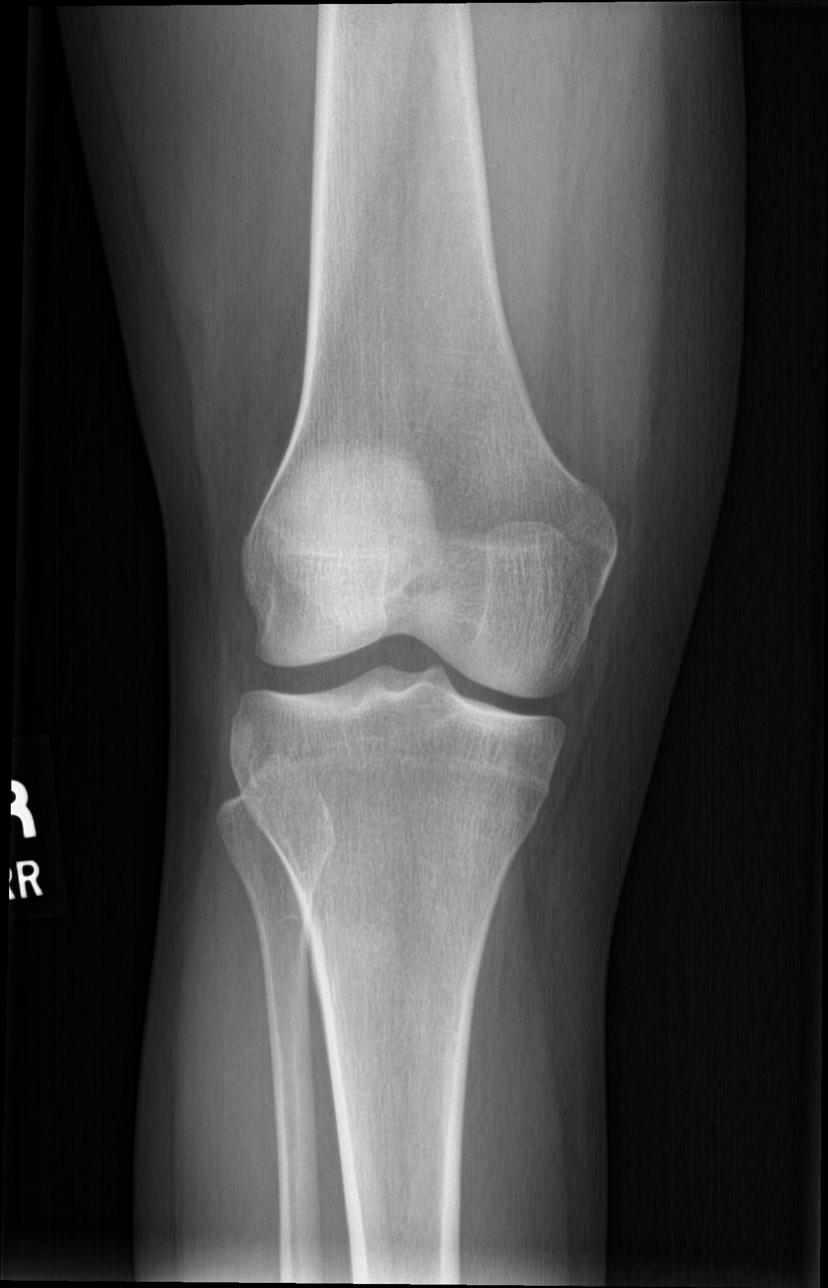

[knee obl (1 of 2)]
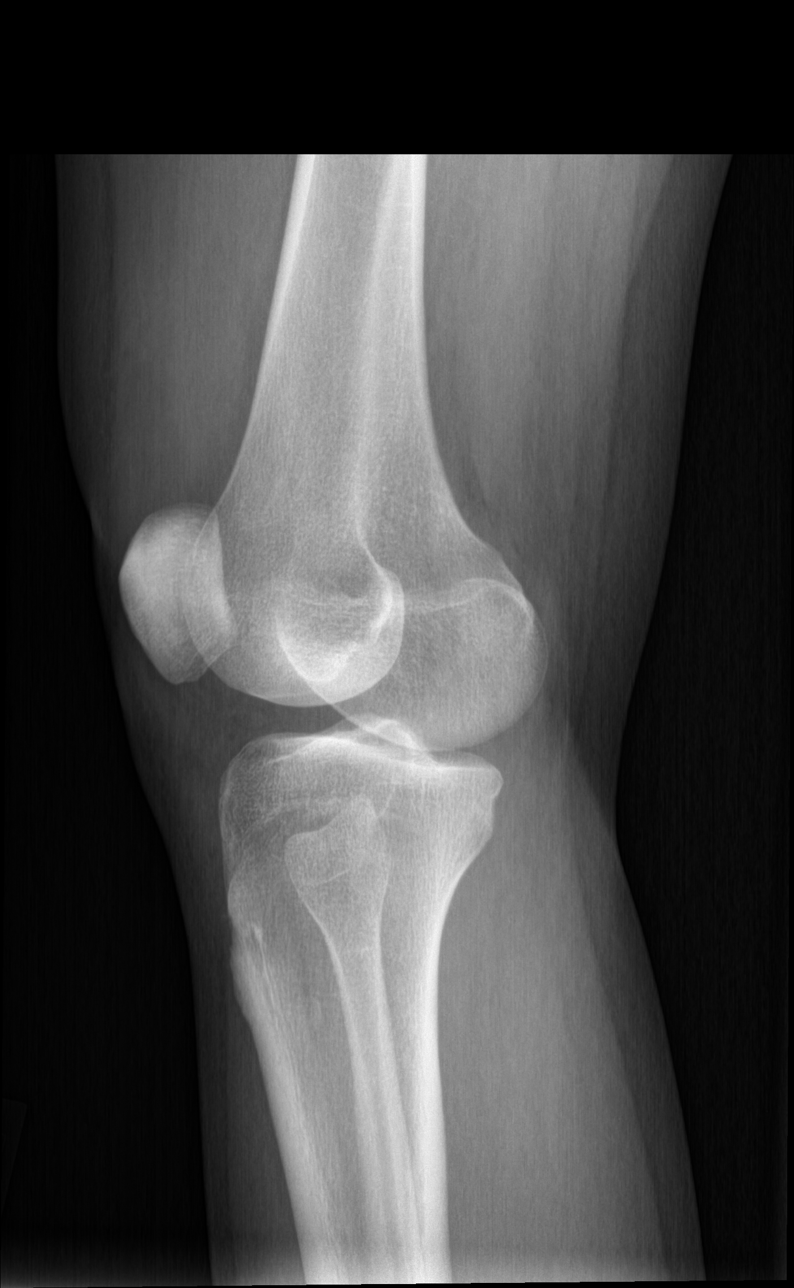

[knee obl (2 of 2)]
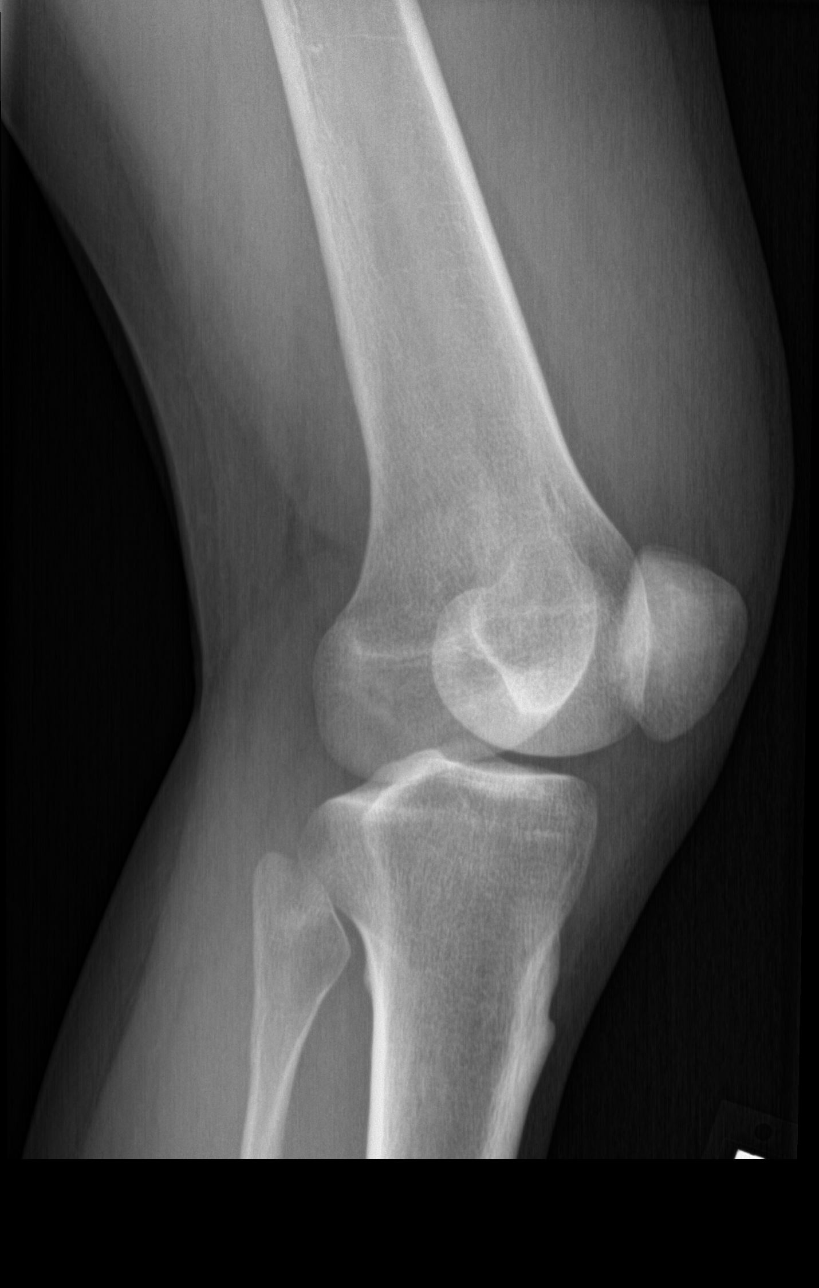

[knee lat]
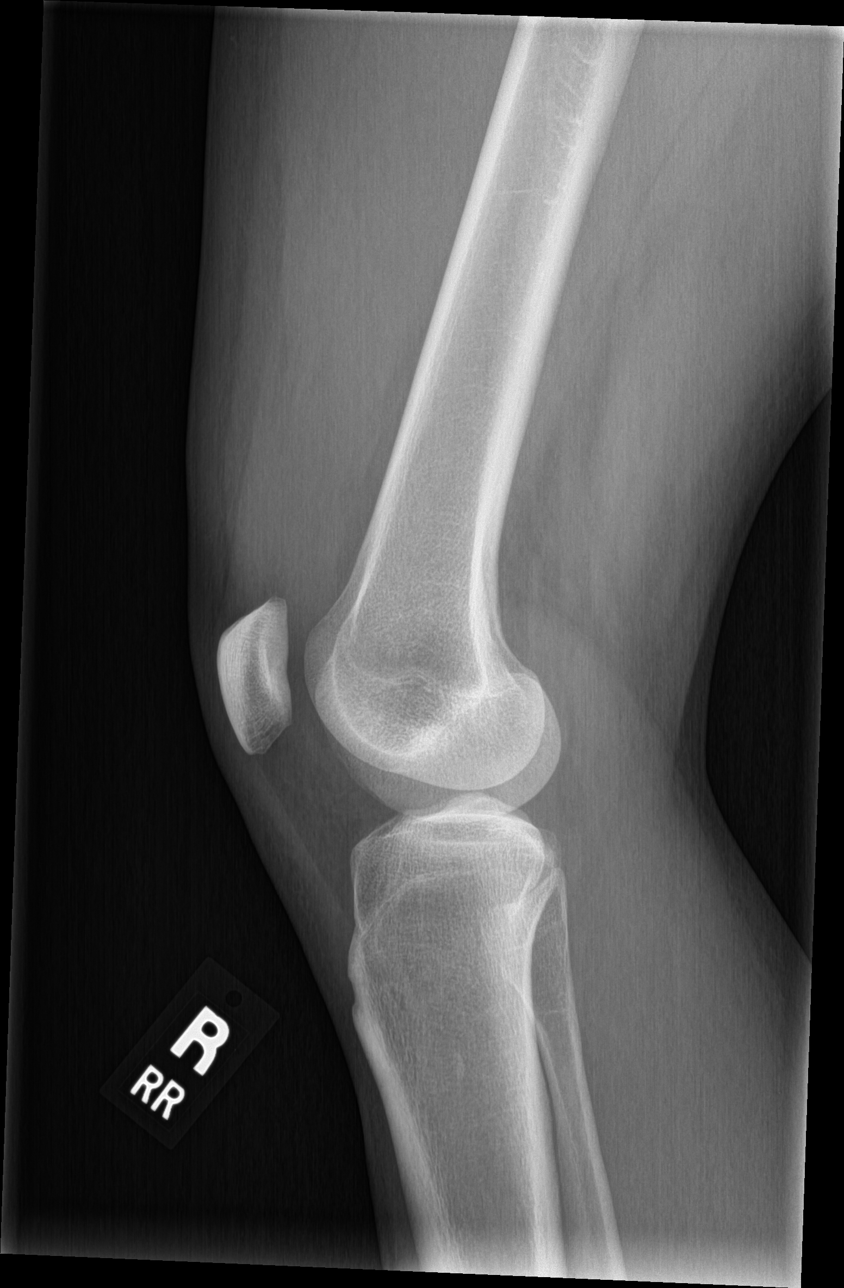

[sunrise]
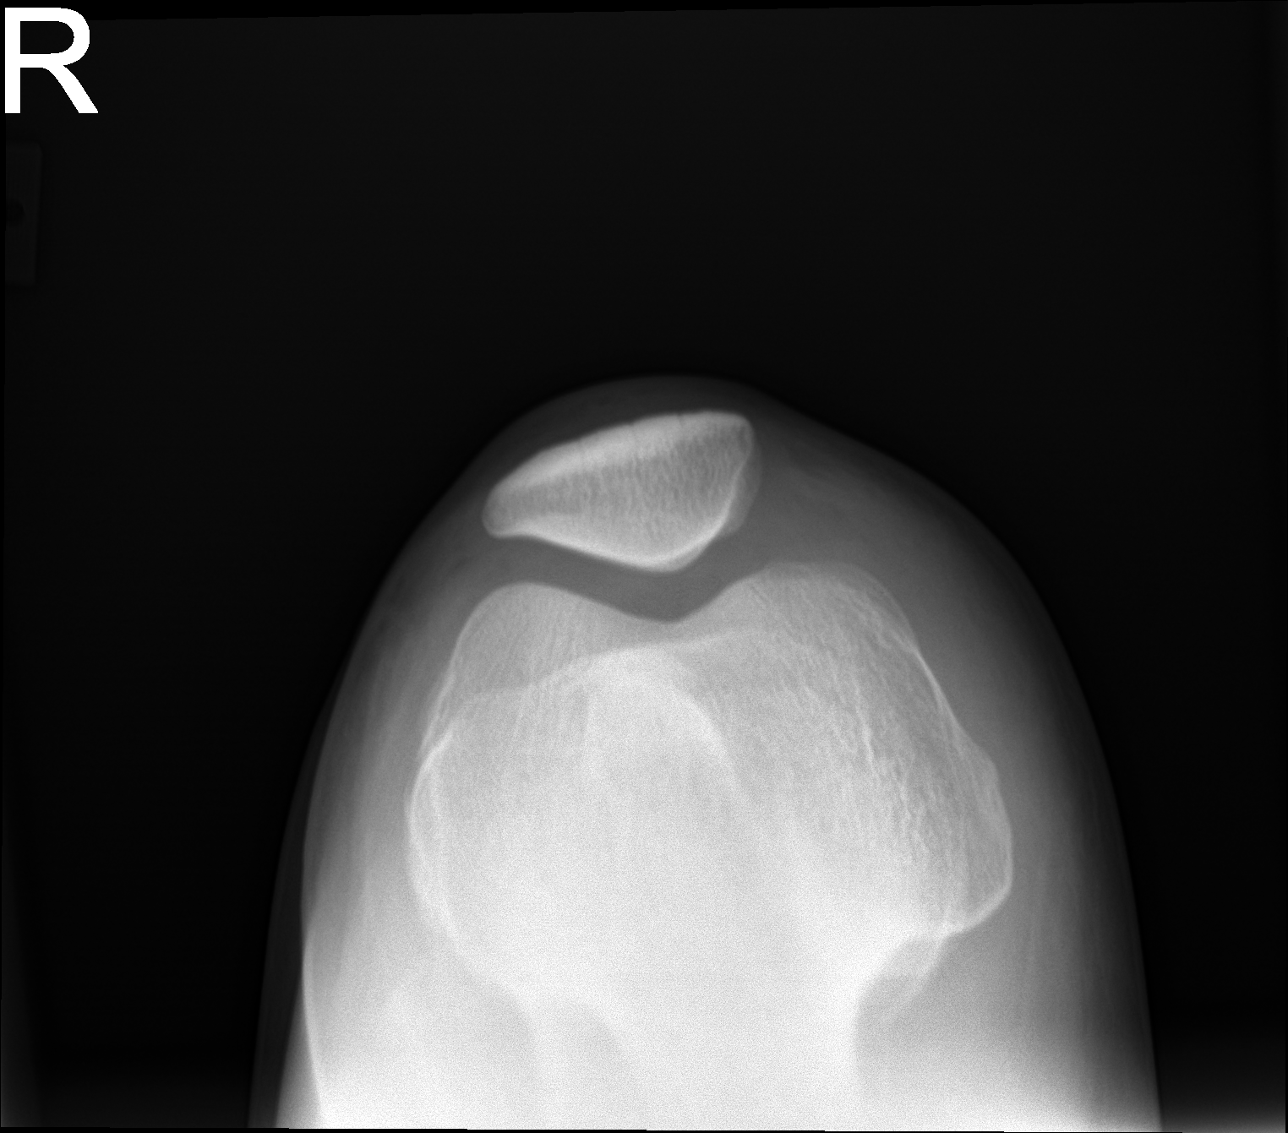

[5 of 5 positions shown; findings below may reference images not displayed]

FINDINGS: No evidence of fracture, dislocation, or joint effusion. No evidence
of arthropathy or other focal bone abnormality. Soft tissues are
unremarkable.
IMPRESSION: Negative.

## 2021-06-06 DIAGNOSIS — Z13220 Encounter for screening for lipoid disorders: Secondary | ICD-10-CM | POA: Diagnosis not present

## 2021-06-06 DIAGNOSIS — M79674 Pain in right toe(s): Secondary | ICD-10-CM | POA: Diagnosis not present

## 2021-06-06 DIAGNOSIS — Z0000 Encounter for general adult medical examination without abnormal findings: Secondary | ICD-10-CM | POA: Diagnosis not present

## 2021-06-06 DIAGNOSIS — Z01419 Encounter for gynecological examination (general) (routine) without abnormal findings: Secondary | ICD-10-CM | POA: Diagnosis not present

## 2021-06-19 DIAGNOSIS — K644 Residual hemorrhoidal skin tags: Secondary | ICD-10-CM | POA: Diagnosis not present

## 2021-06-19 DIAGNOSIS — K5900 Constipation, unspecified: Secondary | ICD-10-CM | POA: Diagnosis not present

## 2021-08-20 DIAGNOSIS — K5900 Constipation, unspecified: Secondary | ICD-10-CM | POA: Diagnosis not present

## 2021-08-20 DIAGNOSIS — K625 Hemorrhage of anus and rectum: Secondary | ICD-10-CM | POA: Diagnosis not present

## 2021-08-27 DIAGNOSIS — K648 Other hemorrhoids: Secondary | ICD-10-CM | POA: Diagnosis not present

## 2021-08-27 DIAGNOSIS — K625 Hemorrhage of anus and rectum: Secondary | ICD-10-CM | POA: Diagnosis not present

## 2021-10-25 DIAGNOSIS — Z23 Encounter for immunization: Secondary | ICD-10-CM | POA: Diagnosis not present

## 2021-10-25 DIAGNOSIS — M5442 Lumbago with sciatica, left side: Secondary | ICD-10-CM | POA: Diagnosis not present

## 2021-10-25 DIAGNOSIS — M5441 Lumbago with sciatica, right side: Secondary | ICD-10-CM | POA: Diagnosis not present

## 2021-11-29 DIAGNOSIS — K6289 Other specified diseases of anus and rectum: Secondary | ICD-10-CM | POA: Diagnosis not present

## 2021-12-07 DIAGNOSIS — F419 Anxiety disorder, unspecified: Secondary | ICD-10-CM | POA: Diagnosis not present

## 2021-12-18 ENCOUNTER — Ambulatory Visit: Payer: Self-pay | Admitting: Dermatology

## 2021-12-18 DIAGNOSIS — D2239 Melanocytic nevi of other parts of face: Secondary | ICD-10-CM | POA: Diagnosis not present

## 2021-12-18 DIAGNOSIS — Z23 Encounter for immunization: Secondary | ICD-10-CM | POA: Diagnosis not present

## 2021-12-18 DIAGNOSIS — D485 Neoplasm of uncertain behavior of skin: Secondary | ICD-10-CM | POA: Diagnosis not present

## 2021-12-18 DIAGNOSIS — D2339 Other benign neoplasm of skin of other parts of face: Secondary | ICD-10-CM | POA: Diagnosis not present

## 2021-12-19 ENCOUNTER — Ambulatory Visit (INDEPENDENT_AMBULATORY_CARE_PROVIDER_SITE_OTHER): Payer: BLUE CROSS/BLUE SHIELD | Admitting: Physician Assistant

## 2021-12-19 ENCOUNTER — Encounter: Payer: Self-pay | Admitting: Physician Assistant

## 2021-12-19 ENCOUNTER — Other Ambulatory Visit: Payer: Self-pay

## 2021-12-19 DIAGNOSIS — L309 Dermatitis, unspecified: Secondary | ICD-10-CM

## 2021-12-19 DIAGNOSIS — L2084 Intrinsic (allergic) eczema: Secondary | ICD-10-CM

## 2021-12-19 MED ORDER — TRIAMCINOLONE ACETONIDE 0.1 % EX CREA: TOPICAL_CREAM | CUTANEOUS | 2 refills | Status: DC

## 2021-12-19 MED ORDER — CLOBETASOL PROPIONATE 0.05 % EX CREA: TOPICAL_CREAM | CUTANEOUS | 6 refills | Status: DC

## 2021-12-19 NOTE — Progress Notes (Signed)
° °  New Patient   Subjective  Lisa Stewart is a 31 y.o. female who presents for the following: New Patient (Initial Visit) and Eczema (Eczema flare x many years currently hands only but does flare on the body, her previous tx hydrocortisone and tac 0.1%). 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  Father new onset of a mm on one of his hands.    The following portions of the chart were reviewed this encounter and updated as appropriate:  Tobacco   Allergies   Meds   Problems   Med Hx   Surg Hx   Fam Hx       Objective  Well appearing patient in no apparent distress; mood and affect are within normal limits.  All skin waist up examined.  both hands Ill-defined pinkplaques with scale-crust.         Assessment & Plan  Eczema of both hands  Intrinsic atopic dermatitis both hands  clobetasol cream (TEMOVATE) 0.05 % - both hands Apply to hands 1-2 times daily not for face or skin folds  triamcinolone cream (KENALOG) 0.1 % - both hands Apply to eczema 1-2 times daily not for face or skin folds   No atypical nevi noted at the time of the visit.  I, Trameka Dorough, PA-C, have reviewed all documentation's for this visit.  The documentation on 12/19/21 for the exam, diagnosis, procedures and orders are all accurate and complete.

## 2022-07-05 ENCOUNTER — Other Ambulatory Visit: Payer: Self-pay | Admitting: Internal Medicine

## 2022-07-05 ENCOUNTER — Ambulatory Visit
Admission: RE | Admit: 2022-07-05 | Discharge: 2022-07-05 | Disposition: A | Discharge status: Home / Self Care | Payer: BC Managed Care – PPO | Source: Ambulatory Visit | Attending: Internal Medicine | Admitting: Internal Medicine

## 2022-07-05 DIAGNOSIS — M79671 Pain in right foot: Secondary | ICD-10-CM

## 2022-07-05 DIAGNOSIS — R55 Syncope and collapse: Secondary | ICD-10-CM | POA: Diagnosis not present

## 2022-07-05 DIAGNOSIS — L309 Dermatitis, unspecified: Secondary | ICD-10-CM | POA: Diagnosis not present

## 2022-07-05 DIAGNOSIS — R87810 Cervical high risk human papillomavirus (HPV) DNA test positive: Secondary | ICD-10-CM | POA: Diagnosis not present

## 2022-07-05 DIAGNOSIS — Z13220 Encounter for screening for lipoid disorders: Secondary | ICD-10-CM | POA: Diagnosis not present

## 2022-07-05 DIAGNOSIS — M25571 Pain in right ankle and joints of right foot: Secondary | ICD-10-CM | POA: Diagnosis not present

## 2022-07-05 DIAGNOSIS — M25572 Pain in left ankle and joints of left foot: Secondary | ICD-10-CM | POA: Diagnosis not present

## 2022-07-05 DIAGNOSIS — M79672 Pain in left foot: Secondary | ICD-10-CM

## 2022-07-05 DIAGNOSIS — D649 Anemia, unspecified: Secondary | ICD-10-CM | POA: Diagnosis not present

## 2022-07-05 DIAGNOSIS — Z0000 Encounter for general adult medical examination without abnormal findings: Secondary | ICD-10-CM | POA: Diagnosis not present

## 2022-07-16 ENCOUNTER — Encounter: Payer: Self-pay | Admitting: Podiatry

## 2022-07-16 ENCOUNTER — Ambulatory Visit (INDEPENDENT_AMBULATORY_CARE_PROVIDER_SITE_OTHER): Payer: BC Managed Care – PPO | Admitting: Podiatry

## 2022-07-16 ENCOUNTER — Ambulatory Visit (INDEPENDENT_AMBULATORY_CARE_PROVIDER_SITE_OTHER): Payer: BC Managed Care – PPO

## 2022-07-16 DIAGNOSIS — M2012 Hallux valgus (acquired), left foot: Secondary | ICD-10-CM

## 2022-07-16 DIAGNOSIS — M7650 Patellar tendinitis, unspecified knee: Secondary | ICD-10-CM | POA: Insufficient documentation

## 2022-07-16 DIAGNOSIS — M2011 Hallux valgus (acquired), right foot: Secondary | ICD-10-CM | POA: Diagnosis not present

## 2022-07-16 DIAGNOSIS — M21612 Bunion of left foot: Secondary | ICD-10-CM | POA: Diagnosis not present

## 2022-07-16 DIAGNOSIS — L309 Dermatitis, unspecified: Secondary | ICD-10-CM | POA: Insufficient documentation

## 2022-07-16 DIAGNOSIS — S62329A Displaced fracture of shaft of unspecified metacarpal bone, initial encounter for closed fracture: Secondary | ICD-10-CM | POA: Insufficient documentation

## 2022-07-16 DIAGNOSIS — M21611 Bunion of right foot: Secondary | ICD-10-CM

## 2022-07-16 DIAGNOSIS — S93409A Sprain of unspecified ligament of unspecified ankle, initial encounter: Secondary | ICD-10-CM | POA: Insufficient documentation

## 2022-07-17 DIAGNOSIS — R55 Syncope and collapse: Secondary | ICD-10-CM | POA: Diagnosis not present

## 2022-07-17 NOTE — Progress Notes (Signed)
  Subjective:  Patient ID: Lisa Stewart, female    DOB: 1991-09-01,  MRN: [id number]  Chief Complaint  Patient presents with   Bunions    Right foot, bilateral xrays done on 07/07/22, left has started hurting as well. Used to wear heels a lot.    31 y.o. female presents with the above complaint. History confirmed with patient.  The right is more painful.  Her mother had bunions as well.  She wants to see if there is treatment for this before it gets worse.  She primarily has pain over the bump that is worse in shoes  Objective:  Physical Exam: warm, good capillary refill, no trophic changes or ulcerative lesions, normal DP and PT pulses, normal sensory exam, and bilateral she has hallux valgus deformity with the right worse than left in severity, significant hypermobility bilateral first ray, abduction of the hallux, good range of motion that is smooth and pain-free of the first metatarsal phalangeal joint.  Deformity is nearly fully reducible.   Radiographs: Multiple views x-ray of both feet: Right worse than left hallux valgus deformity, abduction of the hallux, increase in the first metatarsal angle, no significant structural elevatus noted, increase in the tibial sesamoid position bilaterally right worse than left overall in all deformity planes Assessment:   1. Hallux valgus with bunions, right   2. Hallux valgus with bunions, left      Plan:  Patient was evaluated and treated and all questions answered.  Discussed the etiology and treatment including surgical and non surgical treatment for painful bunions.  She has exhausted all non surgical treatment prior to this visit including shoe gear changes and padding.  We discussed further use of bunion splints or pads and she may try this.  She desires surgical intervention. We discussed all risks including but not limited to: pain, swelling, infection, scar, numbness which may be temporary or permanent, chronic pain, stiffness, nerve  pain or damage, wound healing problems, bone healing problems including delayed or non-union and recurrence. Specifically we discussed the following procedures: Lapidus arthrodesis with bunionectomy, possible Akin osteotomy.  We discussed this and the rationale for the procedure while reviewing her x-rays.  She would like to look at surgical intervention early next year.  She will return later this fall for preoperative planning visit and let me know when she is ready for this.   No follow-ups on file.

## 2022-11-07 ENCOUNTER — Ambulatory Visit (INDEPENDENT_AMBULATORY_CARE_PROVIDER_SITE_OTHER): Payer: BC Managed Care – PPO | Admitting: Podiatry

## 2022-11-07 ENCOUNTER — Encounter: Payer: Self-pay | Admitting: Podiatry

## 2022-11-07 DIAGNOSIS — M21611 Bunion of right foot: Secondary | ICD-10-CM | POA: Diagnosis not present

## 2022-11-07 DIAGNOSIS — M2011 Hallux valgus (acquired), right foot: Secondary | ICD-10-CM | POA: Diagnosis not present

## 2022-11-07 NOTE — Progress Notes (Signed)
  Subjective:  Patient ID: Lisa Stewart, female    DOB: 06-10-1991,  MRN: [id number]  Chief Complaint  Patient presents with   consult    EST - Sx Consult - R foot bunion    31 y.o. female presents with the above complaint. History confirmed with patient.  The right continues to be the painful 1.  She is ready to schedule surgery.  Objective:  Physical Exam: warm, good capillary refill, no trophic changes or ulcerative lesions, normal DP and PT pulses, normal sensory exam, and bilateral she has hallux valgus deformity with the right worse than left in severity, significant hypermobility bilateral first ray, abduction of the hallux, good range of motion that is smooth and pain-free of the first metatarsal phalangeal joint.  Deformity is nearly fully reducible.   Radiographs: Multiple views x-ray of both feet: Right worse than left hallux valgus deformity, abduction of the hallux, increase in the first metatarsal angle, no significant structural elevatus noted, increase in the tibial sesamoid position bilaterally right worse than left overall in all deformity planes Assessment:   1. Hallux valgus with bunions, right      Plan:  Patient was evaluated and treated and all questions answered.  Discussed the etiology and treatment including surgical and non surgical treatment for painful bunions.  She has exhausted all non surgical treatment prior to this visit including shoe gear changes and padding.  We discussed further use of bunion splints or pads and she may try this.  She desires surgical intervention. We discussed all risks including but not limited to: pain, swelling, infection, scar, numbness which may be temporary or permanent, chronic pain, stiffness, nerve pain or damage, wound healing problems, bone healing problems including delayed or non-union and recurrence. Specifically we discussed the following procedures: Lapidus arthrodesis with bunionectomy, possible Akin osteotomy.  We  discussed this and the rationale for the procedure while reviewing her x-rays.  Informed consent was signed and reviewed.   Surgical plan:  Procedure: -Right foot Lapidus arthrodesis with bunionectomy, possible Akin osteotomy possible bone graft from heel  Location: -[facility]  Anesthesia plan: -IV sedation with regional  Postoperative pain plan: - Tylenol 1000 mg every 6 hours, ibuprofen 600 mg every 6 hours, gabapentin 300 mg every 8 hours x5 days, oxycodone 5 mg 1-2 tabs every 6 hours only as needed  DVT prophylaxis: -None required  WB Restrictions / DME needs: -NWB in the boot postop    No follow-ups on file.

## 2022-12-10 ENCOUNTER — Telehealth: Payer: Self-pay | Admitting: Podiatry

## 2022-12-10 NOTE — Telephone Encounter (Signed)
DOS: 01/10/2023  BCBS Effective 06/18/2022  [employer] Bunionectomy Rt [phone]) [place] Osteotomy Rt [phone]) Bone Graft Rt (20900)  Deductible: $1,600 with $0 remaining Out-of-Pocket: $1,600 with $0 met CoInsurance: 0%  Prior authorization is not required per Dede E. Call Reference #: [id number]

## 2022-12-19 ENCOUNTER — Ambulatory Visit: Payer: BLUE CROSS/BLUE SHIELD | Admitting: Physician Assistant

## 2023-01-10 ENCOUNTER — Other Ambulatory Visit: Payer: Self-pay | Admitting: Podiatry

## 2023-01-10 DIAGNOSIS — M21611 Bunion of right foot: Secondary | ICD-10-CM | POA: Diagnosis not present

## 2023-01-10 DIAGNOSIS — M25374 Other instability, right foot: Secondary | ICD-10-CM | POA: Diagnosis not present

## 2023-01-10 DIAGNOSIS — M205X1 Other deformities of toe(s) (acquired), right foot: Secondary | ICD-10-CM | POA: Diagnosis not present

## 2023-01-10 DIAGNOSIS — M2011 Hallux valgus (acquired), right foot: Secondary | ICD-10-CM | POA: Diagnosis not present

## 2023-01-10 DIAGNOSIS — G8918 Other acute postprocedural pain: Secondary | ICD-10-CM | POA: Diagnosis not present

## 2023-01-10 MED ORDER — OXYCODONE HCL 5 MG PO TABS: 5.0000 mg | ORAL_TABLET | ORAL | 0 refills | Status: AC | PRN

## 2023-01-10 MED ORDER — GABAPENTIN 300 MG PO CAPS: 300.0000 mg | ORAL_CAPSULE | Freq: Three times a day (TID) | ORAL | 0 refills | Status: AC

## 2023-01-10 MED ORDER — IBUPROFEN 600 MG PO TABS: 600.0000 mg | ORAL_TABLET | Freq: Four times a day (QID) | ORAL | 0 refills | Status: AC | PRN

## 2023-01-10 MED ORDER — ACETAMINOPHEN 500 MG PO TABS: 1000.0000 mg | ORAL_TABLET | Freq: Four times a day (QID) | ORAL | 0 refills | Status: AC | PRN

## 2023-01-10 NOTE — Progress Notes (Signed)
01/10/23  R bunion correction

## 2023-01-15 ENCOUNTER — Ambulatory Visit (INDEPENDENT_AMBULATORY_CARE_PROVIDER_SITE_OTHER): Payer: BC Managed Care – PPO

## 2023-01-15 ENCOUNTER — Encounter: Payer: Self-pay | Admitting: Podiatry

## 2023-01-15 ENCOUNTER — Ambulatory Visit (INDEPENDENT_AMBULATORY_CARE_PROVIDER_SITE_OTHER): Payer: BC Managed Care – PPO | Admitting: Podiatry

## 2023-01-15 DIAGNOSIS — M21611 Bunion of right foot: Secondary | ICD-10-CM

## 2023-01-15 DIAGNOSIS — M2011 Hallux valgus (acquired), right foot: Secondary | ICD-10-CM

## 2023-01-15 NOTE — Progress Notes (Signed)
  Subjective:  Patient ID: Lisa Stewart, female    DOB: 11-01-1991,  MRN: [id number]  Chief Complaint  Patient presents with   Routine Post Op    POV#1 DOS 02.23.24 [place]. [facility] OSTEOTOMY, & BONE GRAFT RT)    32 y.o. female returns for post-op check.  She is doing well she is having pain as expected but is improving she is only having take oxycodone at night currently  Review of Systems: Negative except as noted in the HPI. Denies N/V/F/Ch.   Objective:  There were no vitals filed for this visit. There is no height or weight on file to calculate BMI. Constitutional Well developed. Well nourished.  Vascular Foot warm and well perfused. Capillary refill normal to all digits.  Calf is soft and supple, no posterior calf or knee pain, negative Homans' sign  Neurologic Normal speech. Oriented to person, place, and time. Epicritic sensation to light touch grossly present bilaterally.  Dermatologic Skin healing well without signs of infection. Skin edges well coapted without signs of infection.  Orthopedic: Tenderness to palpation noted about the surgical site.   Multiple view plain film radiographs: Good correction noted, all hardware intact and in position and equivalent to immediate postoperative films Assessment:   1. Hallux valgus with bunions, right    Plan:  Patient was evaluated and treated and all questions answered.  S/p foot surgery right -Progressing as expected post-operatively. -XR: Noted above -WB Status: NWB in cam walker boot.  She may begin weightbearing as tolerated in the boot following the next visit -Sutures: Plan to remove in 2 weeks. -Medications: No refills required -Foot redressed.  May remove and bathe on Monday  Return in about 2 weeks (around 01/29/2023) for suture removal, post op (no x-rays).

## 2023-01-16 ENCOUNTER — Encounter: Payer: Self-pay | Admitting: Podiatry

## 2023-01-30 ENCOUNTER — Ambulatory Visit (INDEPENDENT_AMBULATORY_CARE_PROVIDER_SITE_OTHER): Payer: BC Managed Care – PPO | Admitting: Podiatry

## 2023-01-30 DIAGNOSIS — M2011 Hallux valgus (acquired), right foot: Secondary | ICD-10-CM

## 2023-01-30 DIAGNOSIS — M21611 Bunion of right foot: Secondary | ICD-10-CM

## 2023-01-30 NOTE — Progress Notes (Signed)
  Subjective:  Patient ID: Lisa Stewart, female    DOB: [birth year],  MRN: [id number]  Chief Complaint  Patient presents with   Routine Post Op    POV#2 DOS 02.23.24 [employer] [facility] OSTEOTOMY, & BONE GRAFT RT) DR. Cruz Bong PT    32 y.o. female returns for post-op check.  Overall doing well not having much pain, back to mostly just Tylenol now.  Review of Systems: Negative except as noted in the HPI. Denies N/V/F/Ch.   Objective:  There were no vitals filed for this visit. There is no height or weight on file to calculate BMI. Constitutional Well developed. Well nourished.  Vascular Foot warm and well perfused. Capillary refill normal to all digits.  Calf is soft and supple, no posterior calf or knee pain, negative Homans' sign  Neurologic Normal speech. Oriented to person, place, and time. Epicritic sensation to light touch grossly present bilaterally.  Dermatologic Incisions well-healed not hypertrophic  Orthopedic: Tenderness to palpation noted about the surgical site.   Multiple view plain film radiographs: Good correction noted, all hardware intact and in position and equivalent to immediate postoperative films Assessment:   1. Hallux valgus with bunions, right    Plan:  Patient was evaluated and treated and all questions answered.  S/p foot surgery right -Overall doing very well she may continue to be WBAT in the cam boot, sutures removed uneventfully today.  She will continue regular bathing.  Compression sleeve applied.  Return in 3 weeks for new radiographs if doing well she will transition back to regular shoe gear  No follow-ups on file.

## 2023-02-11 DIAGNOSIS — F419 Anxiety disorder, unspecified: Secondary | ICD-10-CM | POA: Diagnosis not present

## 2023-02-20 ENCOUNTER — Ambulatory Visit (INDEPENDENT_AMBULATORY_CARE_PROVIDER_SITE_OTHER): Payer: BC Managed Care – PPO

## 2023-02-20 ENCOUNTER — Ambulatory Visit (INDEPENDENT_AMBULATORY_CARE_PROVIDER_SITE_OTHER): Payer: BC Managed Care – PPO | Admitting: Podiatry

## 2023-02-20 DIAGNOSIS — M21611 Bunion of right foot: Secondary | ICD-10-CM

## 2023-02-20 DIAGNOSIS — M2011 Hallux valgus (acquired), right foot: Secondary | ICD-10-CM

## 2023-02-20 MED ORDER — MUPIROCIN 2 % EX OINT: 1.0000 | TOPICAL_OINTMENT | Freq: Every day | CUTANEOUS | 2 refills | Status: AC

## 2023-02-24 NOTE — Progress Notes (Signed)
  Subjective:  Patient ID: Lisa Stewart, female    DOB: 05-19-1991,  MRN: [id number]  Chief Complaint  Patient presents with   Routine Post Op    POV#3 DOS 02.23.24 [employer] Lisa Stewart, & BONE GRAFT RT)    32 y.o. female returns for post-op check.  Overall doing okay, she does have some persistent Scab  Review of Systems: Negative except as noted in the HPI. Denies N/V/F/Ch.   Objective:  There were no vitals filed for this visit. There is no height or weight on file to calculate BMI. Constitutional Well developed. Well nourished.  Vascular Foot warm and well perfused. Capillary refill normal to all digits.  Calf is soft and supple, no posterior calf or knee pain, negative Homans' sign  Neurologic Normal speech. Oriented to person, place, and time. Epicritic sensation to light touch grossly present bilaterally.  Dermatologic Incisions well-healed for the majority there is a small area of scab present  Orthopedic: Tenderness to palpation noted about the surgical site.   Multiple view plain film radiographs: Good consolidation across fusion sites Assessment:   1. Hallux valgus with bunions, right    Plan:  Patient was evaluated and treated and all questions answered.  S/p foot surgery right -Overall doing very well she may continue to be WBAT in a surgical shoe that was dispensed today should be able to transition gradually to shoe gear after the next visit.  Today she had a small persistent scab, following administration of a local topical anesthetic this was area was debrided, which showed a small suture abscess.  She does have a history of MRSA also recommended utilizing Bactroban ointment and Rx was sent to pharmacy.  Return in about 2 weeks (around 03/06/2023) for incision check right foot.

## 2023-02-26 DIAGNOSIS — F419 Anxiety disorder, unspecified: Secondary | ICD-10-CM | POA: Diagnosis not present

## 2023-03-05 ENCOUNTER — Encounter: Payer: Self-pay | Admitting: Podiatry

## 2023-03-05 ENCOUNTER — Ambulatory Visit (INDEPENDENT_AMBULATORY_CARE_PROVIDER_SITE_OTHER): Payer: BC Managed Care – PPO | Admitting: Podiatry

## 2023-03-05 DIAGNOSIS — M21611 Bunion of right foot: Secondary | ICD-10-CM

## 2023-03-05 DIAGNOSIS — M2011 Hallux valgus (acquired), right foot: Secondary | ICD-10-CM

## 2023-03-05 NOTE — Progress Notes (Signed)
  Subjective:  Patient ID: Lisa Stewart, female    DOB: Aug 10, 1991,  MRN: [id number]  Chief Complaint  Patient presents with   Diabetic Ulcer   Routine Post Op     POV#3 DOS 02.23.24 [employer] BUNIONECTOMY, AIKEN OSTEOTOMY, & BONE GRAFT RT) no N/V/F/C/SOB, rate of pain 2 out of 10 TX: surgical shoe      32 y.o. female returns for post-op check.  She returns today for follow-up on the incision, has been using the mupirocin ointment, seems to be doing much better, there is an area of blood blister that formed  Review of Systems: Negative except as noted in the HPI. Denies N/V/F/Ch.   Objective:  There were no vitals filed for this visit. There is no height or weight on file to calculate BMI. Constitutional Well developed. Well nourished.  Vascular Foot warm and well perfused. Capillary refill normal to all digits.  Calf is soft and supple, no posterior calf or knee pain, negative Homans' sign  Neurologic Normal speech. Oriented to person, place, and time. Epicritic sensation to light touch grossly present bilaterally.  Dermatologic Incisions well-healed for the majority there is a small area of scab present, seems to be blood blistering and areas of suture abscess formation  Orthopedic: Edema improved, minimal tenderness   Multiple view plain film radiographs: Good consolidation across fusion sites Assessment:   1. Hallux valgus with bunions, right    Plan:  Patient was evaluated and treated and all questions answered.  S/p foot surgery right -Doing much better, at this point as she is tolerating it and the skin does not rub she may transition back to a regular shoe.  Apply mupirocin ointment until suture abscess is resolved which I advised her will take some time but can leave open to air is much as she would like.  Okay to bandage when in shoes.  I will see her back in 1 month for a final postop visit with new radiographs, she can gradually increase her activity  at this point to include low impact exercise as tolerated  Return in about 1 month (around 04/04/2023) for post op (new x-rays).

## 2023-03-11 DIAGNOSIS — F419 Anxiety disorder, unspecified: Secondary | ICD-10-CM | POA: Diagnosis not present

## 2023-03-24 DIAGNOSIS — F419 Anxiety disorder, unspecified: Secondary | ICD-10-CM | POA: Diagnosis not present

## 2023-04-07 ENCOUNTER — Ambulatory Visit (INDEPENDENT_AMBULATORY_CARE_PROVIDER_SITE_OTHER): Payer: BC Managed Care – PPO

## 2023-04-07 ENCOUNTER — Ambulatory Visit (INDEPENDENT_AMBULATORY_CARE_PROVIDER_SITE_OTHER): Payer: BC Managed Care – PPO | Admitting: Podiatry

## 2023-04-07 DIAGNOSIS — M21611 Bunion of right foot: Secondary | ICD-10-CM | POA: Diagnosis not present

## 2023-04-07 DIAGNOSIS — M2011 Hallux valgus (acquired), right foot: Secondary | ICD-10-CM

## 2023-04-07 NOTE — Progress Notes (Signed)
  Subjective:  Patient ID: Lisa Stewart, female    DOB: 25-Aug-1991,  MRN: [id number]  Chief Complaint  Patient presents with   Routine Post Op    POV#4 DOS 02.23.24 [employer] [place] Mantis OSTEOTOMY, & BONE GRAFT RT)    32 y.o. female returns for post-op check.  She returns today for follow-up has had no problems with the incision there are still some fullness around the scar  Review of Systems: Negative except as noted in the HPI. Denies N/V/F/Ch.   Objective:  There were no vitals filed for this visit. There is no height or weight on file to calculate BMI. Constitutional Well developed. Well nourished.  Vascular Foot warm and well perfused. Capillary refill normal to all digits.  Calf is soft and supple, no posterior calf or knee pain, negative Homans' sign  Neurologic Normal speech. Oriented to person, place, and time. Epicritic sensation to light touch grossly present bilaterally.  Dermatologic Incision is well-healed, slight hypertrophy at previous suture abscess site but no drainage or signs of infection  Orthopedic: She has no edema.  No pain to palpation.  Good range of motion of the MTPJ.   Multiple view plain film radiographs: Complete consolidation across fusion and osteotomy site, no change in position or hardware, good correction noted from preop films Assessment:   1. Hallux valgus with bunions, right    Plan:  Patient was evaluated and treated and all questions answered.  S/p foot surgery right -We reviewed her radiographs.  Bone is fully healed.  Her skin has healed and has minimal hypertrophy.  Hopefully it begins to smooth out with time.  Discussed possibility of removal of the implants if this continues to be an area of tenderness.  Can resume full activity including impact exercise.  Return as needed.  She does have a bunion on the other foot that is occasionally tender but is less severe and she will let me know if she is ready to proceed  with surgical correction of this at any point.  Return if symptoms worsen or fail to improve.

## 2023-04-08 DIAGNOSIS — F419 Anxiety disorder, unspecified: Secondary | ICD-10-CM | POA: Diagnosis not present

## 2023-05-05 DIAGNOSIS — F419 Anxiety disorder, unspecified: Secondary | ICD-10-CM | POA: Diagnosis not present

## 2023-05-19 DIAGNOSIS — F419 Anxiety disorder, unspecified: Secondary | ICD-10-CM | POA: Diagnosis not present

## 2023-06-02 DIAGNOSIS — F419 Anxiety disorder, unspecified: Secondary | ICD-10-CM | POA: Diagnosis not present

## 2023-06-16 DIAGNOSIS — F419 Anxiety disorder, unspecified: Secondary | ICD-10-CM | POA: Diagnosis not present

## 2023-07-02 DIAGNOSIS — F419 Anxiety disorder, unspecified: Secondary | ICD-10-CM | POA: Diagnosis not present

## 2023-07-11 DIAGNOSIS — Z13220 Encounter for screening for lipoid disorders: Secondary | ICD-10-CM | POA: Diagnosis not present

## 2023-07-11 DIAGNOSIS — L309 Dermatitis, unspecified: Secondary | ICD-10-CM | POA: Diagnosis not present

## 2023-07-11 DIAGNOSIS — Z0000 Encounter for general adult medical examination without abnormal findings: Secondary | ICD-10-CM | POA: Diagnosis not present

## 2023-07-11 DIAGNOSIS — Z131 Encounter for screening for diabetes mellitus: Secondary | ICD-10-CM | POA: Diagnosis not present

## 2023-07-14 DIAGNOSIS — F419 Anxiety disorder, unspecified: Secondary | ICD-10-CM | POA: Diagnosis not present

## 2023-07-28 DIAGNOSIS — F419 Anxiety disorder, unspecified: Secondary | ICD-10-CM | POA: Diagnosis not present

## 2023-08-25 DIAGNOSIS — F419 Anxiety disorder, unspecified: Secondary | ICD-10-CM | POA: Diagnosis not present

## 2023-09-08 DIAGNOSIS — F419 Anxiety disorder, unspecified: Secondary | ICD-10-CM | POA: Diagnosis not present

## 2023-09-30 DIAGNOSIS — F419 Anxiety disorder, unspecified: Secondary | ICD-10-CM | POA: Diagnosis not present

## 2023-10-22 DIAGNOSIS — F419 Anxiety disorder, unspecified: Secondary | ICD-10-CM | POA: Diagnosis not present

## 2023-12-03 DIAGNOSIS — F419 Anxiety disorder, unspecified: Secondary | ICD-10-CM | POA: Diagnosis not present

## 2023-12-04 DIAGNOSIS — S61259A Open bite of unspecified finger without damage to nail, initial encounter: Secondary | ICD-10-CM | POA: Diagnosis not present

## 2023-12-04 DIAGNOSIS — W540XXA Bitten by dog, initial encounter: Secondary | ICD-10-CM | POA: Diagnosis not present

## 2023-12-04 DIAGNOSIS — S61252A Open bite of right middle finger without damage to nail, initial encounter: Secondary | ICD-10-CM | POA: Diagnosis not present

## 2023-12-17 DIAGNOSIS — F419 Anxiety disorder, unspecified: Secondary | ICD-10-CM | POA: Diagnosis not present

## 2024-03-24 DIAGNOSIS — F419 Anxiety disorder, unspecified: Secondary | ICD-10-CM | POA: Diagnosis not present

## 2024-05-19 DIAGNOSIS — F419 Anxiety disorder, unspecified: Secondary | ICD-10-CM | POA: Diagnosis not present

## 2024-06-03 DIAGNOSIS — F419 Anxiety disorder, unspecified: Secondary | ICD-10-CM | POA: Diagnosis not present

## 2024-06-23 DIAGNOSIS — Z975 Presence of (intrauterine) contraceptive device: Secondary | ICD-10-CM | POA: Diagnosis not present

## 2024-06-23 DIAGNOSIS — L0291 Cutaneous abscess, unspecified: Secondary | ICD-10-CM | POA: Diagnosis not present

## 2024-06-24 DIAGNOSIS — Z30432 Encounter for removal of intrauterine contraceptive device: Secondary | ICD-10-CM | POA: Diagnosis not present

## 2024-08-13 DIAGNOSIS — K5900 Constipation, unspecified: Secondary | ICD-10-CM | POA: Diagnosis not present

## 2024-08-13 DIAGNOSIS — Z0000 Encounter for general adult medical examination without abnormal findings: Secondary | ICD-10-CM | POA: Diagnosis not present

## 2024-08-13 DIAGNOSIS — Z87828 Personal history of other (healed) physical injury and trauma: Secondary | ICD-10-CM | POA: Diagnosis not present

## 2024-08-13 DIAGNOSIS — Z23 Encounter for immunization: Secondary | ICD-10-CM | POA: Diagnosis not present

## 2024-08-13 DIAGNOSIS — L309 Dermatitis, unspecified: Secondary | ICD-10-CM | POA: Diagnosis not present
